# Patient Record
Sex: Female | Born: 1940 | Race: Black or African American | Hispanic: No | Marital: Single | Smoking: Never smoker
Health system: Southern US, Community
[De-identification: ages and names within clinical notes are randomized; demographics above are authoritative.]

---

## 2018-11-20 DIAGNOSIS — E875 Hyperkalemia: Secondary | ICD-10-CM

## 2018-11-20 DIAGNOSIS — D649 Anemia, unspecified: Secondary | ICD-10-CM | POA: Diagnosis not present

## 2018-11-20 DIAGNOSIS — I10 Essential (primary) hypertension: Secondary | ICD-10-CM

## 2018-11-20 DIAGNOSIS — W19XXXA Unspecified fall, initial encounter: Secondary | ICD-10-CM

## 2018-11-20 DIAGNOSIS — E1169 Type 2 diabetes mellitus with other specified complication: Secondary | ICD-10-CM

## 2018-11-20 DIAGNOSIS — T68XXXA Hypothermia, initial encounter: Secondary | ICD-10-CM | POA: Diagnosis not present

## 2018-11-20 DIAGNOSIS — N390 Urinary tract infection, site not specified: Secondary | ICD-10-CM

## 2018-11-21 DIAGNOSIS — T68XXXA Hypothermia, initial encounter: Secondary | ICD-10-CM | POA: Diagnosis not present

## 2018-11-21 DIAGNOSIS — I10 Essential (primary) hypertension: Secondary | ICD-10-CM | POA: Diagnosis not present

## 2018-11-21 DIAGNOSIS — D649 Anemia, unspecified: Secondary | ICD-10-CM | POA: Diagnosis not present

## 2018-11-21 DIAGNOSIS — N390 Urinary tract infection, site not specified: Secondary | ICD-10-CM | POA: Diagnosis not present

## 2018-11-22 DIAGNOSIS — N390 Urinary tract infection, site not specified: Secondary | ICD-10-CM | POA: Diagnosis not present

## 2018-11-22 DIAGNOSIS — T68XXXA Hypothermia, initial encounter: Secondary | ICD-10-CM | POA: Diagnosis not present

## 2018-11-22 DIAGNOSIS — D649 Anemia, unspecified: Secondary | ICD-10-CM | POA: Diagnosis not present

## 2018-11-22 DIAGNOSIS — I10 Essential (primary) hypertension: Secondary | ICD-10-CM | POA: Diagnosis not present

## 2018-11-23 DIAGNOSIS — W19XXXA Unspecified fall, initial encounter: Secondary | ICD-10-CM | POA: Diagnosis not present

## 2018-11-23 DIAGNOSIS — N390 Urinary tract infection, site not specified: Secondary | ICD-10-CM | POA: Diagnosis not present

## 2018-11-23 DIAGNOSIS — E039 Hypothyroidism, unspecified: Secondary | ICD-10-CM

## 2018-11-23 DIAGNOSIS — F209 Schizophrenia, unspecified: Secondary | ICD-10-CM

## 2018-11-24 DIAGNOSIS — E1169 Type 2 diabetes mellitus with other specified complication: Secondary | ICD-10-CM | POA: Diagnosis not present

## 2018-11-24 DIAGNOSIS — N390 Urinary tract infection, site not specified: Secondary | ICD-10-CM | POA: Diagnosis not present

## 2018-11-24 DIAGNOSIS — F209 Schizophrenia, unspecified: Secondary | ICD-10-CM | POA: Diagnosis not present

## 2018-11-24 DIAGNOSIS — E039 Hypothyroidism, unspecified: Secondary | ICD-10-CM | POA: Diagnosis not present

## 2018-11-25 DIAGNOSIS — F209 Schizophrenia, unspecified: Secondary | ICD-10-CM | POA: Diagnosis not present

## 2018-11-25 DIAGNOSIS — E039 Hypothyroidism, unspecified: Secondary | ICD-10-CM | POA: Diagnosis not present

## 2018-11-25 DIAGNOSIS — E1169 Type 2 diabetes mellitus with other specified complication: Secondary | ICD-10-CM | POA: Diagnosis not present

## 2018-11-25 DIAGNOSIS — N390 Urinary tract infection, site not specified: Secondary | ICD-10-CM | POA: Diagnosis not present

## 2018-11-26 DIAGNOSIS — E1169 Type 2 diabetes mellitus with other specified complication: Secondary | ICD-10-CM | POA: Diagnosis not present

## 2018-11-26 DIAGNOSIS — N390 Urinary tract infection, site not specified: Secondary | ICD-10-CM | POA: Diagnosis not present

## 2018-11-26 DIAGNOSIS — F209 Schizophrenia, unspecified: Secondary | ICD-10-CM | POA: Diagnosis not present

## 2018-11-26 DIAGNOSIS — E039 Hypothyroidism, unspecified: Secondary | ICD-10-CM | POA: Diagnosis not present

## 2018-11-27 DIAGNOSIS — E039 Hypothyroidism, unspecified: Secondary | ICD-10-CM | POA: Diagnosis not present

## 2018-11-27 DIAGNOSIS — F209 Schizophrenia, unspecified: Secondary | ICD-10-CM | POA: Diagnosis not present

## 2018-11-27 DIAGNOSIS — N390 Urinary tract infection, site not specified: Secondary | ICD-10-CM | POA: Diagnosis not present

## 2018-11-27 DIAGNOSIS — E1169 Type 2 diabetes mellitus with other specified complication: Secondary | ICD-10-CM | POA: Diagnosis not present

## 2018-11-28 DIAGNOSIS — E1169 Type 2 diabetes mellitus with other specified complication: Secondary | ICD-10-CM | POA: Diagnosis not present

## 2018-11-28 DIAGNOSIS — F209 Schizophrenia, unspecified: Secondary | ICD-10-CM | POA: Diagnosis not present

## 2018-11-28 DIAGNOSIS — E039 Hypothyroidism, unspecified: Secondary | ICD-10-CM | POA: Diagnosis not present

## 2018-11-28 DIAGNOSIS — N390 Urinary tract infection, site not specified: Secondary | ICD-10-CM | POA: Diagnosis not present

## 2018-11-29 DIAGNOSIS — E039 Hypothyroidism, unspecified: Secondary | ICD-10-CM | POA: Diagnosis not present

## 2018-11-29 DIAGNOSIS — N390 Urinary tract infection, site not specified: Secondary | ICD-10-CM | POA: Diagnosis not present

## 2018-11-29 DIAGNOSIS — F209 Schizophrenia, unspecified: Secondary | ICD-10-CM | POA: Diagnosis not present

## 2018-11-29 DIAGNOSIS — E1169 Type 2 diabetes mellitus with other specified complication: Secondary | ICD-10-CM | POA: Diagnosis not present

## 2019-01-17 DIAGNOSIS — D72819 Decreased white blood cell count, unspecified: Secondary | ICD-10-CM

## 2019-01-17 DIAGNOSIS — E1169 Type 2 diabetes mellitus with other specified complication: Secondary | ICD-10-CM

## 2019-01-17 DIAGNOSIS — I959 Hypotension, unspecified: Secondary | ICD-10-CM | POA: Diagnosis not present

## 2019-01-17 DIAGNOSIS — T68XXXA Hypothermia, initial encounter: Secondary | ICD-10-CM | POA: Diagnosis not present

## 2019-01-17 DIAGNOSIS — D649 Anemia, unspecified: Secondary | ICD-10-CM

## 2019-01-17 DIAGNOSIS — N179 Acute kidney failure, unspecified: Secondary | ICD-10-CM

## 2019-01-17 DIAGNOSIS — A419 Sepsis, unspecified organism: Secondary | ICD-10-CM | POA: Diagnosis not present

## 2019-01-17 DIAGNOSIS — N390 Urinary tract infection, site not specified: Secondary | ICD-10-CM | POA: Diagnosis not present

## 2019-01-17 DIAGNOSIS — F209 Schizophrenia, unspecified: Secondary | ICD-10-CM

## 2019-01-18 DIAGNOSIS — N179 Acute kidney failure, unspecified: Secondary | ICD-10-CM

## 2019-01-18 DIAGNOSIS — D72819 Decreased white blood cell count, unspecified: Secondary | ICD-10-CM

## 2019-01-18 DIAGNOSIS — E1169 Type 2 diabetes mellitus with other specified complication: Secondary | ICD-10-CM

## 2019-01-18 DIAGNOSIS — F209 Schizophrenia, unspecified: Secondary | ICD-10-CM

## 2019-01-18 DIAGNOSIS — N390 Urinary tract infection, site not specified: Secondary | ICD-10-CM | POA: Diagnosis not present

## 2019-01-18 DIAGNOSIS — A419 Sepsis, unspecified organism: Secondary | ICD-10-CM | POA: Diagnosis not present

## 2019-01-18 DIAGNOSIS — I959 Hypotension, unspecified: Secondary | ICD-10-CM | POA: Diagnosis not present

## 2019-01-18 DIAGNOSIS — D649 Anemia, unspecified: Secondary | ICD-10-CM

## 2019-01-18 DIAGNOSIS — T68XXXA Hypothermia, initial encounter: Secondary | ICD-10-CM | POA: Diagnosis not present

## 2019-01-19 DIAGNOSIS — I959 Hypotension, unspecified: Secondary | ICD-10-CM | POA: Diagnosis not present

## 2019-01-19 DIAGNOSIS — N390 Urinary tract infection, site not specified: Secondary | ICD-10-CM | POA: Diagnosis not present

## 2019-01-19 DIAGNOSIS — T68XXXA Hypothermia, initial encounter: Secondary | ICD-10-CM | POA: Diagnosis not present

## 2019-01-19 DIAGNOSIS — A419 Sepsis, unspecified organism: Secondary | ICD-10-CM | POA: Diagnosis not present

## 2019-01-20 DIAGNOSIS — A419 Sepsis, unspecified organism: Secondary | ICD-10-CM | POA: Diagnosis not present

## 2019-01-20 DIAGNOSIS — I959 Hypotension, unspecified: Secondary | ICD-10-CM | POA: Diagnosis not present

## 2019-01-20 DIAGNOSIS — T68XXXA Hypothermia, initial encounter: Secondary | ICD-10-CM | POA: Diagnosis not present

## 2019-01-20 DIAGNOSIS — N390 Urinary tract infection, site not specified: Secondary | ICD-10-CM | POA: Diagnosis not present

## 2019-01-21 DIAGNOSIS — A419 Sepsis, unspecified organism: Secondary | ICD-10-CM | POA: Diagnosis not present

## 2019-01-21 DIAGNOSIS — N390 Urinary tract infection, site not specified: Secondary | ICD-10-CM | POA: Diagnosis not present

## 2019-01-21 DIAGNOSIS — T68XXXA Hypothermia, initial encounter: Secondary | ICD-10-CM | POA: Diagnosis not present

## 2019-01-21 DIAGNOSIS — I959 Hypotension, unspecified: Secondary | ICD-10-CM | POA: Diagnosis not present

## 2019-01-22 DIAGNOSIS — A419 Sepsis, unspecified organism: Secondary | ICD-10-CM | POA: Diagnosis not present

## 2019-01-22 DIAGNOSIS — T68XXXA Hypothermia, initial encounter: Secondary | ICD-10-CM | POA: Diagnosis not present

## 2019-01-22 DIAGNOSIS — I959 Hypotension, unspecified: Secondary | ICD-10-CM | POA: Diagnosis not present

## 2019-01-22 DIAGNOSIS — N390 Urinary tract infection, site not specified: Secondary | ICD-10-CM | POA: Diagnosis not present

## 2019-01-23 DIAGNOSIS — I959 Hypotension, unspecified: Secondary | ICD-10-CM | POA: Diagnosis not present

## 2019-01-23 DIAGNOSIS — A419 Sepsis, unspecified organism: Secondary | ICD-10-CM | POA: Diagnosis not present

## 2019-01-23 DIAGNOSIS — N390 Urinary tract infection, site not specified: Secondary | ICD-10-CM | POA: Diagnosis not present

## 2019-01-23 DIAGNOSIS — T68XXXA Hypothermia, initial encounter: Secondary | ICD-10-CM | POA: Diagnosis not present

## 2019-01-24 DIAGNOSIS — T68XXXA Hypothermia, initial encounter: Secondary | ICD-10-CM | POA: Diagnosis not present

## 2019-01-24 DIAGNOSIS — I959 Hypotension, unspecified: Secondary | ICD-10-CM | POA: Diagnosis not present

## 2019-01-24 DIAGNOSIS — A419 Sepsis, unspecified organism: Secondary | ICD-10-CM | POA: Diagnosis not present

## 2019-01-24 DIAGNOSIS — N390 Urinary tract infection, site not specified: Secondary | ICD-10-CM | POA: Diagnosis not present

## 2019-01-30 DIAGNOSIS — A419 Sepsis, unspecified organism: Secondary | ICD-10-CM | POA: Diagnosis not present

## 2019-01-30 DIAGNOSIS — F209 Schizophrenia, unspecified: Secondary | ICD-10-CM

## 2019-01-30 DIAGNOSIS — N390 Urinary tract infection, site not specified: Secondary | ICD-10-CM | POA: Diagnosis not present

## 2019-01-30 DIAGNOSIS — E039 Hypothyroidism, unspecified: Secondary | ICD-10-CM

## 2019-01-30 DIAGNOSIS — T68XXXA Hypothermia, initial encounter: Secondary | ICD-10-CM | POA: Diagnosis not present

## 2019-01-30 DIAGNOSIS — I10 Essential (primary) hypertension: Secondary | ICD-10-CM

## 2019-01-30 DIAGNOSIS — R001 Bradycardia, unspecified: Secondary | ICD-10-CM | POA: Diagnosis not present

## 2019-01-30 DIAGNOSIS — E1169 Type 2 diabetes mellitus with other specified complication: Secondary | ICD-10-CM

## 2019-01-30 DIAGNOSIS — D72819 Decreased white blood cell count, unspecified: Secondary | ICD-10-CM

## 2019-01-30 DIAGNOSIS — D61818 Other pancytopenia: Secondary | ICD-10-CM

## 2019-01-30 DIAGNOSIS — I959 Hypotension, unspecified: Secondary | ICD-10-CM

## 2019-01-30 DIAGNOSIS — D649 Anemia, unspecified: Secondary | ICD-10-CM

## 2019-01-31 DIAGNOSIS — I959 Hypotension, unspecified: Secondary | ICD-10-CM | POA: Diagnosis not present

## 2019-01-31 DIAGNOSIS — E1169 Type 2 diabetes mellitus with other specified complication: Secondary | ICD-10-CM | POA: Diagnosis not present

## 2019-01-31 DIAGNOSIS — D61818 Other pancytopenia: Secondary | ICD-10-CM | POA: Diagnosis not present

## 2019-01-31 DIAGNOSIS — T68XXXA Hypothermia, initial encounter: Secondary | ICD-10-CM | POA: Diagnosis not present

## 2019-02-01 DIAGNOSIS — D61818 Other pancytopenia: Secondary | ICD-10-CM | POA: Diagnosis not present

## 2019-02-01 DIAGNOSIS — T68XXXA Hypothermia, initial encounter: Secondary | ICD-10-CM | POA: Diagnosis not present

## 2019-02-01 DIAGNOSIS — I959 Hypotension, unspecified: Secondary | ICD-10-CM | POA: Diagnosis not present

## 2019-02-01 DIAGNOSIS — E1169 Type 2 diabetes mellitus with other specified complication: Secondary | ICD-10-CM | POA: Diagnosis not present

## 2019-02-02 DIAGNOSIS — I959 Hypotension, unspecified: Secondary | ICD-10-CM | POA: Diagnosis not present

## 2019-02-02 DIAGNOSIS — E1169 Type 2 diabetes mellitus with other specified complication: Secondary | ICD-10-CM | POA: Diagnosis not present

## 2019-02-02 DIAGNOSIS — D61818 Other pancytopenia: Secondary | ICD-10-CM | POA: Diagnosis not present

## 2019-02-02 DIAGNOSIS — T68XXXA Hypothermia, initial encounter: Secondary | ICD-10-CM | POA: Diagnosis not present

## 2019-02-03 DIAGNOSIS — D61818 Other pancytopenia: Secondary | ICD-10-CM | POA: Diagnosis not present

## 2019-02-03 DIAGNOSIS — E1169 Type 2 diabetes mellitus with other specified complication: Secondary | ICD-10-CM | POA: Diagnosis not present

## 2019-02-03 DIAGNOSIS — I959 Hypotension, unspecified: Secondary | ICD-10-CM | POA: Diagnosis not present

## 2019-02-03 DIAGNOSIS — T68XXXA Hypothermia, initial encounter: Secondary | ICD-10-CM | POA: Diagnosis not present

## 2019-02-04 DIAGNOSIS — D61818 Other pancytopenia: Secondary | ICD-10-CM | POA: Diagnosis not present

## 2019-02-04 DIAGNOSIS — T68XXXA Hypothermia, initial encounter: Secondary | ICD-10-CM | POA: Diagnosis not present

## 2019-02-04 DIAGNOSIS — I959 Hypotension, unspecified: Secondary | ICD-10-CM | POA: Diagnosis not present

## 2019-02-04 DIAGNOSIS — E1169 Type 2 diabetes mellitus with other specified complication: Secondary | ICD-10-CM | POA: Diagnosis not present

## 2019-02-05 DIAGNOSIS — D61818 Other pancytopenia: Secondary | ICD-10-CM | POA: Diagnosis not present

## 2019-02-05 DIAGNOSIS — E1169 Type 2 diabetes mellitus with other specified complication: Secondary | ICD-10-CM | POA: Diagnosis not present

## 2019-02-05 DIAGNOSIS — T68XXXA Hypothermia, initial encounter: Secondary | ICD-10-CM | POA: Diagnosis not present

## 2019-02-05 DIAGNOSIS — I959 Hypotension, unspecified: Secondary | ICD-10-CM | POA: Diagnosis not present

## 2019-02-06 DIAGNOSIS — I959 Hypotension, unspecified: Secondary | ICD-10-CM | POA: Diagnosis not present

## 2019-02-06 DIAGNOSIS — D61818 Other pancytopenia: Secondary | ICD-10-CM | POA: Diagnosis not present

## 2019-02-06 DIAGNOSIS — T68XXXA Hypothermia, initial encounter: Secondary | ICD-10-CM | POA: Diagnosis not present

## 2019-02-06 DIAGNOSIS — E1169 Type 2 diabetes mellitus with other specified complication: Secondary | ICD-10-CM | POA: Diagnosis not present

## 2019-02-24 DIAGNOSIS — D696 Thrombocytopenia, unspecified: Secondary | ICD-10-CM

## 2019-02-24 DIAGNOSIS — R4182 Altered mental status, unspecified: Secondary | ICD-10-CM | POA: Diagnosis not present

## 2019-02-24 DIAGNOSIS — I10 Essential (primary) hypertension: Secondary | ICD-10-CM

## 2019-02-24 DIAGNOSIS — I959 Hypotension, unspecified: Secondary | ICD-10-CM | POA: Diagnosis not present

## 2019-02-24 DIAGNOSIS — T68XXXA Hypothermia, initial encounter: Secondary | ICD-10-CM | POA: Diagnosis not present

## 2019-02-24 DIAGNOSIS — E1169 Type 2 diabetes mellitus with other specified complication: Secondary | ICD-10-CM

## 2019-02-24 DIAGNOSIS — E039 Hypothyroidism, unspecified: Secondary | ICD-10-CM | POA: Diagnosis not present

## 2019-02-25 DIAGNOSIS — E876 Hypokalemia: Secondary | ICD-10-CM | POA: Diagnosis not present

## 2019-02-25 DIAGNOSIS — D649 Anemia, unspecified: Secondary | ICD-10-CM | POA: Diagnosis not present

## 2019-02-25 DIAGNOSIS — I959 Hypotension, unspecified: Secondary | ICD-10-CM | POA: Diagnosis not present

## 2019-02-25 DIAGNOSIS — T68XXXA Hypothermia, initial encounter: Secondary | ICD-10-CM | POA: Diagnosis not present

## 2019-02-26 DIAGNOSIS — F209 Schizophrenia, unspecified: Secondary | ICD-10-CM

## 2019-02-26 DIAGNOSIS — E1169 Type 2 diabetes mellitus with other specified complication: Secondary | ICD-10-CM | POA: Diagnosis not present

## 2019-02-26 DIAGNOSIS — T68XXXA Hypothermia, initial encounter: Secondary | ICD-10-CM | POA: Diagnosis not present

## 2019-02-26 DIAGNOSIS — E035 Myxedema coma: Secondary | ICD-10-CM

## 2019-02-26 DIAGNOSIS — D696 Thrombocytopenia, unspecified: Secondary | ICD-10-CM | POA: Diagnosis not present

## 2019-02-27 DIAGNOSIS — E035 Myxedema coma: Secondary | ICD-10-CM | POA: Diagnosis not present

## 2019-02-27 DIAGNOSIS — E1169 Type 2 diabetes mellitus with other specified complication: Secondary | ICD-10-CM | POA: Diagnosis not present

## 2019-02-27 DIAGNOSIS — T68XXXA Hypothermia, initial encounter: Secondary | ICD-10-CM | POA: Diagnosis not present

## 2019-02-27 DIAGNOSIS — D696 Thrombocytopenia, unspecified: Secondary | ICD-10-CM | POA: Diagnosis not present

## 2019-02-28 DIAGNOSIS — E1169 Type 2 diabetes mellitus with other specified complication: Secondary | ICD-10-CM | POA: Diagnosis not present

## 2019-02-28 DIAGNOSIS — E035 Myxedema coma: Secondary | ICD-10-CM | POA: Diagnosis not present

## 2019-02-28 DIAGNOSIS — D696 Thrombocytopenia, unspecified: Secondary | ICD-10-CM | POA: Diagnosis not present

## 2019-02-28 DIAGNOSIS — T68XXXA Hypothermia, initial encounter: Secondary | ICD-10-CM | POA: Diagnosis not present

## 2019-03-01 DIAGNOSIS — D696 Thrombocytopenia, unspecified: Secondary | ICD-10-CM | POA: Diagnosis not present

## 2019-03-01 DIAGNOSIS — T68XXXA Hypothermia, initial encounter: Secondary | ICD-10-CM | POA: Diagnosis not present

## 2019-03-01 DIAGNOSIS — E1169 Type 2 diabetes mellitus with other specified complication: Secondary | ICD-10-CM | POA: Diagnosis not present

## 2019-03-01 DIAGNOSIS — E035 Myxedema coma: Secondary | ICD-10-CM | POA: Diagnosis not present

## 2019-03-02 DIAGNOSIS — T68XXXA Hypothermia, initial encounter: Secondary | ICD-10-CM | POA: Diagnosis not present

## 2019-03-02 DIAGNOSIS — E1169 Type 2 diabetes mellitus with other specified complication: Secondary | ICD-10-CM | POA: Diagnosis not present

## 2019-03-02 DIAGNOSIS — E035 Myxedema coma: Secondary | ICD-10-CM | POA: Diagnosis not present

## 2019-03-02 DIAGNOSIS — D696 Thrombocytopenia, unspecified: Secondary | ICD-10-CM | POA: Diagnosis not present

## 2019-04-24 DIAGNOSIS — N3000 Acute cystitis without hematuria: Secondary | ICD-10-CM

## 2019-04-24 DIAGNOSIS — I10 Essential (primary) hypertension: Secondary | ICD-10-CM

## 2019-04-24 DIAGNOSIS — A0472 Enterocolitis due to Clostridium difficile, not specified as recurrent: Secondary | ICD-10-CM

## 2019-04-24 DIAGNOSIS — Z66 Do not resuscitate: Secondary | ICD-10-CM

## 2019-04-24 DIAGNOSIS — A4189 Other specified sepsis: Secondary | ICD-10-CM

## 2019-04-24 DIAGNOSIS — T83511A Infection and inflammatory reaction due to indwelling urethral catheter, initial encounter: Secondary | ICD-10-CM

## 2019-04-24 DIAGNOSIS — R680 Hypothermia, not associated with low environmental temperature: Secondary | ICD-10-CM

## 2019-04-24 DIAGNOSIS — G9341 Metabolic encephalopathy: Secondary | ICD-10-CM

## 2019-04-24 DIAGNOSIS — I959 Hypotension, unspecified: Secondary | ICD-10-CM

## 2019-04-25 DIAGNOSIS — G9341 Metabolic encephalopathy: Secondary | ICD-10-CM | POA: Diagnosis not present

## 2019-04-25 DIAGNOSIS — A4189 Other specified sepsis: Secondary | ICD-10-CM | POA: Diagnosis not present

## 2019-04-25 DIAGNOSIS — N3000 Acute cystitis without hematuria: Secondary | ICD-10-CM | POA: Diagnosis not present

## 2019-04-25 DIAGNOSIS — I959 Hypotension, unspecified: Secondary | ICD-10-CM | POA: Diagnosis not present

## 2019-04-26 DIAGNOSIS — I959 Hypotension, unspecified: Secondary | ICD-10-CM | POA: Diagnosis not present

## 2019-04-26 DIAGNOSIS — N3000 Acute cystitis without hematuria: Secondary | ICD-10-CM | POA: Diagnosis not present

## 2019-04-26 DIAGNOSIS — A4189 Other specified sepsis: Secondary | ICD-10-CM | POA: Diagnosis not present

## 2019-04-26 DIAGNOSIS — G9341 Metabolic encephalopathy: Secondary | ICD-10-CM | POA: Diagnosis not present

## 2019-04-28 DIAGNOSIS — A4189 Other specified sepsis: Secondary | ICD-10-CM | POA: Diagnosis not present

## 2019-04-28 DIAGNOSIS — N3000 Acute cystitis without hematuria: Secondary | ICD-10-CM | POA: Diagnosis not present

## 2019-04-28 DIAGNOSIS — I959 Hypotension, unspecified: Secondary | ICD-10-CM | POA: Diagnosis not present

## 2019-04-28 DIAGNOSIS — G9341 Metabolic encephalopathy: Secondary | ICD-10-CM | POA: Diagnosis not present

## 2019-05-05 DIAGNOSIS — J189 Pneumonia, unspecified organism: Secondary | ICD-10-CM

## 2019-05-05 DIAGNOSIS — E039 Hypothyroidism, unspecified: Secondary | ICD-10-CM

## 2019-05-05 DIAGNOSIS — I10 Essential (primary) hypertension: Secondary | ICD-10-CM

## 2019-05-05 DIAGNOSIS — N390 Urinary tract infection, site not specified: Secondary | ICD-10-CM

## 2019-05-05 DIAGNOSIS — E1169 Type 2 diabetes mellitus with other specified complication: Secondary | ICD-10-CM

## 2019-05-05 DIAGNOSIS — R4182 Altered mental status, unspecified: Secondary | ICD-10-CM

## 2019-05-05 DIAGNOSIS — E876 Hypokalemia: Secondary | ICD-10-CM

## 2019-05-05 DIAGNOSIS — D649 Anemia, unspecified: Secondary | ICD-10-CM

## 2019-05-05 DIAGNOSIS — T68XXXA Hypothermia, initial encounter: Secondary | ICD-10-CM

## 2019-05-05 DIAGNOSIS — A419 Sepsis, unspecified organism: Secondary | ICD-10-CM

## 2019-05-05 DIAGNOSIS — R569 Unspecified convulsions: Secondary | ICD-10-CM

## 2019-05-06 DIAGNOSIS — T68XXXA Hypothermia, initial encounter: Secondary | ICD-10-CM | POA: Diagnosis not present

## 2019-05-06 DIAGNOSIS — N390 Urinary tract infection, site not specified: Secondary | ICD-10-CM | POA: Diagnosis not present

## 2019-05-06 DIAGNOSIS — R569 Unspecified convulsions: Secondary | ICD-10-CM | POA: Diagnosis not present

## 2019-05-06 DIAGNOSIS — A419 Sepsis, unspecified organism: Secondary | ICD-10-CM | POA: Diagnosis not present

## 2019-05-07 ENCOUNTER — Inpatient Hospital Stay (HOSPITAL_COMMUNITY)
Admission: AD | Admit: 2019-05-07 | Discharge: 2019-05-24 | DRG: 871 | Disposition: E | Payer: Medicare Other | Source: Other Acute Inpatient Hospital | Attending: Internal Medicine | Admitting: Internal Medicine

## 2019-05-07 ENCOUNTER — Inpatient Hospital Stay (HOSPITAL_COMMUNITY): Payer: Medicare Other

## 2019-05-07 DIAGNOSIS — Z1612 Extended spectrum beta lactamase (ESBL) resistance: Secondary | ICD-10-CM | POA: Diagnosis present

## 2019-05-07 DIAGNOSIS — Z79890 Hormone replacement therapy (postmenopausal): Secondary | ICD-10-CM

## 2019-05-07 DIAGNOSIS — A0472 Enterocolitis due to Clostridium difficile, not specified as recurrent: Secondary | ICD-10-CM | POA: Diagnosis present

## 2019-05-07 DIAGNOSIS — A419 Sepsis, unspecified organism: Principal | ICD-10-CM

## 2019-05-07 DIAGNOSIS — Z515 Encounter for palliative care: Secondary | ICD-10-CM

## 2019-05-07 DIAGNOSIS — Z79899 Other long term (current) drug therapy: Secondary | ICD-10-CM

## 2019-05-07 DIAGNOSIS — G9349 Other encephalopathy: Secondary | ICD-10-CM | POA: Diagnosis present

## 2019-05-07 DIAGNOSIS — E039 Hypothyroidism, unspecified: Secondary | ICD-10-CM | POA: Diagnosis not present

## 2019-05-07 DIAGNOSIS — L89629 Pressure ulcer of left heel, unspecified stage: Secondary | ICD-10-CM | POA: Diagnosis present

## 2019-05-07 DIAGNOSIS — L89129 Pressure ulcer of left upper back, unspecified stage: Secondary | ICD-10-CM | POA: Diagnosis present

## 2019-05-07 DIAGNOSIS — B59 Pneumocystosis: Secondary | ICD-10-CM | POA: Diagnosis not present

## 2019-05-07 DIAGNOSIS — N390 Urinary tract infection, site not specified: Secondary | ICD-10-CM | POA: Diagnosis not present

## 2019-05-07 DIAGNOSIS — L89119 Pressure ulcer of right upper back, unspecified stage: Secondary | ICD-10-CM | POA: Diagnosis present

## 2019-05-07 DIAGNOSIS — R9389 Abnormal findings on diagnostic imaging of other specified body structures: Secondary | ICD-10-CM | POA: Diagnosis not present

## 2019-05-07 DIAGNOSIS — I10 Essential (primary) hypertension: Secondary | ICD-10-CM | POA: Insufficient documentation

## 2019-05-07 DIAGNOSIS — E1169 Type 2 diabetes mellitus with other specified complication: Secondary | ICD-10-CM | POA: Diagnosis not present

## 2019-05-07 DIAGNOSIS — J189 Pneumonia, unspecified organism: Secondary | ICD-10-CM

## 2019-05-07 DIAGNOSIS — Z87440 Personal history of urinary (tract) infections: Secondary | ICD-10-CM

## 2019-05-07 DIAGNOSIS — T68XXXA Hypothermia, initial encounter: Secondary | ICD-10-CM | POA: Diagnosis not present

## 2019-05-07 DIAGNOSIS — G0490 Encephalitis and encephalomyelitis, unspecified: Secondary | ICD-10-CM | POA: Diagnosis present

## 2019-05-07 DIAGNOSIS — E785 Hyperlipidemia, unspecified: Secondary | ICD-10-CM | POA: Diagnosis not present

## 2019-05-07 DIAGNOSIS — R4182 Altered mental status, unspecified: Secondary | ICD-10-CM | POA: Diagnosis present

## 2019-05-07 DIAGNOSIS — I110 Hypertensive heart disease with heart failure: Secondary | ICD-10-CM | POA: Diagnosis present

## 2019-05-07 DIAGNOSIS — L8940 Pressure ulcer of contiguous site of back, buttock and hip, unspecified stage: Secondary | ICD-10-CM | POA: Diagnosis not present

## 2019-05-07 DIAGNOSIS — I509 Heart failure, unspecified: Secondary | ICD-10-CM | POA: Diagnosis present

## 2019-05-07 DIAGNOSIS — D638 Anemia in other chronic diseases classified elsewhere: Secondary | ICD-10-CM

## 2019-05-07 DIAGNOSIS — R569 Unspecified convulsions: Secondary | ICD-10-CM | POA: Diagnosis not present

## 2019-05-07 DIAGNOSIS — L89139 Pressure ulcer of right lower back, unspecified stage: Secondary | ICD-10-CM | POA: Diagnosis present

## 2019-05-07 DIAGNOSIS — Z66 Do not resuscitate: Secondary | ICD-10-CM | POA: Diagnosis present

## 2019-05-07 DIAGNOSIS — G40901 Epilepsy, unspecified, not intractable, with status epilepticus: Secondary | ICD-10-CM | POA: Diagnosis present

## 2019-05-07 DIAGNOSIS — F0390 Unspecified dementia without behavioral disturbance: Secondary | ICD-10-CM | POA: Diagnosis present

## 2019-05-07 DIAGNOSIS — G039 Meningitis, unspecified: Secondary | ICD-10-CM | POA: Diagnosis not present

## 2019-05-07 DIAGNOSIS — F209 Schizophrenia, unspecified: Secondary | ICD-10-CM

## 2019-05-07 DIAGNOSIS — L8990 Pressure ulcer of unspecified site, unspecified stage: Secondary | ICD-10-CM | POA: Insufficient documentation

## 2019-05-07 DIAGNOSIS — R400 Somnolence: Secondary | ICD-10-CM | POA: Diagnosis not present

## 2019-05-07 DIAGNOSIS — L89149 Pressure ulcer of left lower back, unspecified stage: Secondary | ICD-10-CM | POA: Diagnosis present

## 2019-05-07 DIAGNOSIS — F202 Catatonic schizophrenia: Secondary | ICD-10-CM | POA: Diagnosis present

## 2019-05-07 LAB — CBC
HCT: 29.1 % — ABNORMAL LOW (ref 36.0–46.0)
Hemoglobin: 8.6 g/dL — ABNORMAL LOW (ref 12.0–15.0)
MCH: 27.7 pg (ref 26.0–34.0)
MCHC: 29.6 g/dL — ABNORMAL LOW (ref 30.0–36.0)
MCV: 93.6 fL (ref 80.0–100.0)
Platelets: 222 10*3/uL (ref 150–400)
RBC: 3.11 MIL/uL — ABNORMAL LOW (ref 3.87–5.11)
RDW: 19.9 % — ABNORMAL HIGH (ref 11.5–15.5)
WBC: 6 10*3/uL (ref 4.0–10.5)
nRBC: 0.3 % — ABNORMAL HIGH (ref 0.0–0.2)

## 2019-05-07 LAB — C DIFFICILE QUICK SCREEN W PCR REFLEX
C Diff antigen: NEGATIVE
C Diff interpretation: NOT DETECTED
C Diff toxin: NEGATIVE

## 2019-05-07 LAB — CREATININE, SERUM
Creatinine, Ser: 0.5 mg/dL (ref 0.44–1.00)
GFR calc Af Amer: >60 mL/min (ref >60)
GFR calc non Af Amer: >60 mL/min (ref >60)

## 2019-05-07 LAB — GLUCOSE, CAPILLARY: Glucose-Capillary: 109 mg/dL — ABNORMAL HIGH (ref 70–99)

## 2019-05-07 MED ORDER — DEXTROSE 5 % IV SOLN
650.0000 mg | Freq: Three times a day (TID) | INTRAVENOUS | Status: DC
  Administered 2019-05-07 – 2019-05-09 (×5): 650 mg via INTRAVENOUS
  Filled 2019-05-07 (×5): qty 13
  Filled 2019-05-07: qty 10
  Filled 2019-05-07: qty 13

## 2019-05-07 MED ORDER — ENOXAPARIN SODIUM 40 MG/0.4ML ~~LOC~~ SOLN
40.0000 mg | SUBCUTANEOUS | Status: DC
  Administered 2019-05-07: 40 mg via SUBCUTANEOUS
  Filled 2019-05-07: qty 0.4

## 2019-05-07 MED ORDER — INSULIN ASPART 100 UNIT/ML ~~LOC~~ SOLN
0.0000 [IU] | Freq: Three times a day (TID) | SUBCUTANEOUS | Status: DC
  Administered 2019-05-08: 7 [IU] via SUBCUTANEOUS
  Administered 2019-05-08: 4 [IU] via SUBCUTANEOUS
  Administered 2019-05-08: 3 [IU] via SUBCUTANEOUS

## 2019-05-07 MED ORDER — SODIUM CHLORIDE 0.45 % IV SOLN
INTRAVENOUS | Status: DC
  Administered 2019-05-07 – 2019-05-08 (×2): via INTRAVENOUS

## 2019-05-07 MED ORDER — SODIUM CHLORIDE 0.9 % IV SOLN
1.0000 g | INTRAVENOUS | Status: DC
  Administered 2019-05-07: 1000 mg via INTRAVENOUS
  Filled 2019-05-07 (×2): qty 1

## 2019-05-07 MED ORDER — SODIUM CHLORIDE 0.9 % IV SOLN
1000.0000 mg | INTRAVENOUS | Status: DC
  Administered 2019-05-07 – 2019-05-08 (×2): 1000 mg via INTRAVENOUS
  Filled 2019-05-07 (×3): qty 8

## 2019-05-07 MED ORDER — LEVETIRACETAM IN NACL 500 MG/100ML IV SOLN
500.0000 mg | Freq: Two times a day (BID) | INTRAVENOUS | Status: DC
  Administered 2019-05-07 – 2019-05-09 (×5): 500 mg via INTRAVENOUS
  Filled 2019-05-07 (×5): qty 100

## 2019-05-07 NOTE — Progress Notes (Signed)
Pt arrived to 3W23. Responds to pain only. Not following commands at this time. Multiple skin wounds, measured, cleaned and redressed on admission. Foley in place, placed on 6/12 by [facility] for acute urinary retention per RN Collie Siad in report. Currently on 2L O2 [place]. [facility] admissions notified of arrival, and currently awaiting admission orders.

## 2019-05-07 NOTE — Progress Notes (Signed)
Procedure note: Lumbar Puncture  Discussed risks/benefits of LP on the patient with her relative, Alvester Chou, over the telephone. Risks were described including spinal cord injury, nerve root injury, infection and headache. Benefits consist of the diagnostic information used to guide treatment. After full discussion, Mr. Sharlett Iles provided informed consent over the telephone. Consent was witnessed by Rolene Course, RN.  The patient was prepped and draped in sterile fashion. Landmark of intecristal line was used to locate L4-L5 level. Patient was anesthetized locally with 1% lidocaine, followed by introduction of the spinal needle. Three attempts at accessing the subdural space were made unsuccessfully. Spinal needle was withdrawn, pressure applied to puncture site and bandage applied.   Will need fluorscopically guided LP in the AM.   Electronically signed: Dr. Kerney Elbe

## 2019-05-07 NOTE — H&P (Signed)
History and Physical    Erica Juarez RKY:[id number] DOB: Jun 01, 1941 DOA: 05/23/2019  PCP: Patient, No Pcp Per (Confirm with patient/family/NH records and if not entered, this has to be entered at [facility] point of entry) Patient coming from: Transfer from [facility] notes, labs and ED notes from [facility] reviewed  Chief Complaint: Encephalopathy,  Seizure, sepsis  HPI: Erica Juarez is a 78 y.o. female with medical history significant of schizophrenia, diabetes, hypertension, hypothyroid disease.  She was hospitalized at [facility] for approximately 1 week secondary to UTI sepsis and subsequent C. difficile.  Return to [facility] from SNF secondary to encephalopathy and question of concern for seizures.  It was thought this might be secondary to urinary tract infection.  EEG showed global slowing and MRI revealed concern for recent seizure activity versus status epilepticus and also concern for encephalitis.  She was obtunded and responsive only to painful stimuli.  Baseline was awake and responsive although demented..  At [facility] telemedicine for neurology was consulted who recommended starting Keppra.  Felt delirium or seizure she could be the cause of her symptoms.  Her neurologic condition including being unresponsive and flaccid plus MRI was suggestive of possible encephalitis.  A lumbar puncture would be needed for definitive diagnosis which could not be performed at [facility].  Dr. Leonel Ramsay on-call for neurology at [facility] was notified and he accepted the patient in transfer.  He will see her upon arrival for further evaluation.  With a concern  for encephalitis the patient was given a IV dose of acyclovir.  Treatment for urinary tract infection will continue with ertapenem.  The patient's condition and prognosis is poor  ED Course: Patient is being transferred from inpatient status to [facility]  Review of Systems: Patient is  obtunded and a review of systems could not be obtained  Past medical history obtained from [place] records hypertension history of recurrent urinary tract infection to arthritis anemia Beatties hypothyroid disease dementia schizophrenia.  Past surgical history reported  Social history based on medical record from [facility] patient was residing in a skilled care facility secondary to her dementia and multiple comorbidities as family members have been advised to have her transferred to [facility].   has no history on file for tobacco, alcohol, and drug.  Not on File  No family history on file.   Prior to Admission medications   Sensipar 30 mg p.o. daily, hydralazine 50 mg 3 times daily, magnesium oxide 400 mg daily, Flomax 0.4 mg daily   D3 1000 units daily  lisinopril 20 mg daily  DuoNeb nebulizers every  6 prn Multivitamin daily Terry supplement-house shake 9 protein liquid 60 mL 3 times daily Vitamin C 500 mg twice daily Folic acid 1 mg every morning Levothyroxine 175 mcg daily risperidone 1 mL p.o. twice daily zinc sulfate 220 mg p.o. daily    Physical Exam: Vitals:   05/16/2019 1429 04/26/2019 1709  BP: 124/72 137/78  Pulse: 96 88  Resp: 12 15    Constitutional: NAD, calm, comfortable Vitals:   04/24/2019 1429 04/26/2019 1709  BP: 124/72 137/78  Pulse: 96 88  Resp: 12 15   General: Elderly black female overweight lying in bed is unresponsive in no distress  eyes: PERRL, lids and conjunctivae normal.  Neck: normal, supple, no masses, no thyromegaly Respiratory: clear to auscultation bilaterally, no wheezing, . Normal respiratory effort. No accessory muscle use.  Cardiovascular: Regular rate and rhythm, no  murmurs / rubs / gallops. No extremity edema. 2+ pedal pulses. No carotid bruits.  Abdomen: Obese, no tenderness, no masses palpated. No hepatosplenomegaly. Bowel sounds positive.  Musculoskeletal: no clubbing / cyanosis. No joint deformity  upper and lower extremities. Good ROM, no contractures. Normal muscle tone.  Skin: Bandaged right heel over stage III or IV decubitus with black eschar, and over left heel over stage II ulcer, sponge dressing over center of the low back over small ulcers with largest being 4 cm in length stage 3, several minor skin lesions inner thighs that are superficial Neurologic: Obtunded with no response to verbal stimuli renal nerves II through XII: Normal facial symmetry deviation of the tongue has equal eye movements but not to direction, pupils appear equal.  Did not check for tongue deviation or gag reflex.  Motor strength patient is generally flaccid in all 4 extremities DTRs could not be elicited.  Patient withdraws to painful stimulus Psychiatric: Could not assess due to patient's obtunded state   Labs on Admission: I have personally reviewed following labs and imaging studies Laboratory reports from end of hospital were reviewed: 05/05/2019- COVID negative, and ammonia level less than 9 05/04/19 -TSH 7.82, c-Met normal except for potassium of 3.4.  Note liver functions were normal.  Total protein 6.2, albumin 2.4.  White count 4300 hemoglobin 9.1 g, hematocrit 29.2, platelet count 269,000.  UA with too many white blood cells to count disease 20-30 blood cultures from 05/04/2023 no growth at 24 hours blood cultures from 05/06/2019 no growth at 24 hours.   Nasal swab 05/05/2019 was positive for MRSA urine drug screen 05/05/2019 was negative c-Met from 05/06/2019 was negative  CBC from 05/06/2019 with a hemoglobin dropped to 7.7, hematocrit 24.1 white count of 7400 CBC 05/19/2019 with BBC 5.5 hemoglobin 8.1 g, hematocrit 25.3, platelet count [facility] Chest x-ray 05/03/2009 with new left basilar airspace opacity concerning for developing pneumonia versus aspiration.  Blunting of the left costophrenic angle suggestive of trace left-sided pleural effusion CT head without contrast space  05/04/2019 PresSsion motion degraded exam.  No definite acute intracranial abnormality detected.  Persistent opacification of bilateral mastoid air cells similar to prior study. MRI head without contrast 1320 pression treated diffusion involving bilateral cerebral cortex and thalami in a largely symmetric pattern without significant swelling.  This is favored to reflect the sequelae of recent seizure activity versus status seizure.  Early encephalitis is less likely consideration unless her clinical signs of infection in which case lumbar puncture would be recommended.  EEG- port from cardiopulmonary services [facility] impression was mild diffuse slow wave abnormality indicating slight generalized neurophysiologic disturbance.  This may be in part postictal.  This can also be seen in patients with toxic or metabolic encephalopathy or in patients with primary neurodegenerative disorders.  EKG performed at [facility] reveals a sinus rhythm junctional changes.  Possible anterior infarct age undetermined  CBC: No results for input(s): WBC, NEUTROABS, HGB, HCT, MCV, PLT in the last 168 hours. Basic Metabolic Panel: No results for input(s): NA, K, CL, CO2, GLUCOSE, BUN, CREATININE, CALCIUM, MG, PHOS in the last 168 hours. GFR: CrCl cannot be calculated (No successful lab value found.). Liver Function Tests: No results for input(s): AST, ALT, ALKPHOS, BILITOT, PROT, ALBUMIN in the last 168 hours. No results for input(s): LIPASE, AMYLASE in the last 168 hours. No results for input(s): AMMONIA in the last 168 hours. Coagulation Profile: No results for input(s): INR, PROTIME in the last  168 hours. Cardiac Enzymes: No results for input(s): CKTOTAL, CKMB, CKMBINDEX, TROPONINI in the last 168 hours. BNP (last 3 results) No results for input(s): PROBNP in the last 8760 hours. HbA1C: No results for input(s): HGBA1C in the last 72 hours. CBG: No results for input(s): GLUCAP in the last  168 hours. Lipid Profile: No results for input(s): CHOL, HDL, LDLCALC, TRIG, CHOLHDL, LDLDIRECT in the last 72 hours. Thyroid Function Tests: No results for input(s): TSH, T4TOTAL, FREET4, T3FREE, THYROIDAB in the last 72 hours. Anemia Panel: No results for input(s): VITAMINB12, FOLATE, FERRITIN, TIBC, IRON, RETICCTPCT in the last 72 hours. Urine analysis: No results found for: COLORURINE, APPEARANCEUR, LABSPEC, PHURINE, GLUCOSEU, HGBUR, BILIRUBINUR, KETONESUR, PROTEINUR, UROBILINOGEN, NITRITE, LEUKOCYTESUR  Radiological Exams on Admission: No results found.  EKG: Independently reviewed.  See above  Assessment/Plan Active Problems:   Acute alteration in mental status   Sepsis due to urinary tract infection (HCC)   Type 2 diabetes mellitus with hyperlipidemia (HCC)   Hypothyroidism   Schizophrenia (HCC)   PNA (pneumonia)   Anemia in other chronic diseases classified elsewhere   AMS (altered mental status)   1.  Alteration in mental status -elderly demented patient with multiple comorbidities including recent bout of sepsis still receiving vancomycin as an outpatient prior to admission 6/12 at [facility]  Patient presented with acute mental status changes including being unresponsive and flaccid.  Patients evaluation is inconclusive although there is a concern for possible encephalitis versus delirium related to sepsis versus other.  There is good evidence that she has had seizures.  consultation from tele-neurology at [facility] favor the latter. Plan Kirkpatrick for the neurology service has been notified of the patient's admission.  Will guide the evaluation at this point.  Acyclovir 1 g IV was administered for potential encephalitis.  We will continue with acyclovir IV  Given the patient's obtunded state she is made n.p.o. will be supported with IV fluids.  Continue IV Keppra 2.  Sepsis secondary to ESBL urinary tract infection-diagnosis was established around off  hospital.  Patient has been started on ertapenem 1 g every 24 Plan will continue ertapenem at this time  Follow-up CBC in a.m.   Follow-up UA in several days to check for clearance of infection  3.  Pneumonia patient does have abnormality on chest x-ray that could be an infiltrate versus aspiration.  He has been afebrile and has had no elevation in white blood count mitigating against active pneumonia. Plan follow-up portable chest x-ray  Coverage with above antibiotic  Will keep head of bed elevated to 30 degrees to reduce risk of aspiration  4.  Diabetes -patient currently n.p.o.  Glucose levels have been adequately controlled Plan CBG monitoring with sliding scale  5.  Hypothyroidism- TSH was minimally elevated at 7.25.  Patient currently n.p.o. Plan will hold medication for several days.  Patient can take p.o.'s will resume home medication  6.  Anemia with no evidence of blood loss.  Suspect anemia of chronic disease.  By outside laboratory the patient's hemoglobin has been stable Plan follow-up CBC in a.m.   6.  Schizophrenia -records indicate the patient has been stable.  At this time we will hold her medications.  7. Skin breakdown - at admission patient had multiple areas of skin breakdown including  relatively advanced decubitus ulcer left heel right heel and mid back. Plan order placed for nursing to perform wound care  Wound care consult if needed  DVT prophylaxis: Lovenox  Code Status: DNR-comfort  care.  MOST form is on the chart.  Patient is for IV fluids as needed (Full/Partial (specify details) Family Communication: Transferring physician had spoken with the family who is aware of her transfer to [facility] hospital in her condition  Disposition Plan: SNF if patient is able to be discharged Consults called: Neurology service Dr. Leonel Ramsay (with names) Admission status: Inpatient admission   Adella Hare MD [facility] Pager [phone]  If 7PM-7AM, please  contact night-coverage [email or web address] Password [facility]  04/24/2019, 5:21 PM

## 2019-05-07 NOTE — Progress Notes (Signed)
WOUNDS PRESENT ON ADMISSION (LxWxD) Proximal R groin (11x0.5x0cm) Distal L groin (7.5x0.5x0cm) R Upper inner thigh proximal (1.5x0.6x0cm) R Upper inner thigh distal (1.1x0.4x0cm) L medial heel UNSTAGEABLE (4x3.5x0.7cm) R ankle (3.1x2.1x0cm) R medial heel (2.1x2.2x0cm) R outer heel (1.2x0.8x0cm) L buttocks STAGE 3 (5x3x0.3cm) Mid upper buttocks (4.5x1x0cm) R Proximal buttocks (1x0.5x0cm) R distal buttocks (2x1.2x0cm) R lateral buttocks (0.5x0.3x0cm)  All wounds were assessed by this RN and Lannette Donath. All wounds were cleansed and dressed on assessment.

## 2019-05-07 NOTE — Consult Note (Addendum)
NEURO HOSPITALIST CONSULT NOTE   Requesting physician: Dr. Linda Hedges  Reason for Consult: Encephalopathy  History obtained from:   Chart     HPI:                                                                                                                                          Erica Juarez is an 78 y.o. female with dementia who presented to [facility] in transfer from [facility] today for evaluation of possible encephalitis. She has a history of schizophrenia, HTN, hypothyroidism and DM. She has been residing in a skilled care facility secondary to her dementia and multiple comorbidities. She had initially been hospitalized recently at OSH for about a week for treatment of UTI, sepsis and C.difficile colitis. She was discharged to SNF but returned to [facility] with symptoms concerning for seizures and encephalopathy, initially thought to be secondary to UTI, a significant drop from her baseline of being awake and responsive. EEG at [facility] showed global slowing. MRI revealed strikingly symmetric hyperintense signal abnormality on DWI within the cortical grey matter of the medial frontal lobes as well as the insular cortices bilaterally; the findings were read by Radiology as being concerning for recent seizure activity versus status epilepticus versus encephalitis. At the OSH she was obtunded and responsive to painful stimuli only. Telemedicine was consulted and she was loaded with Keppra; the Teleneurologist's DDx included delirium and seizure. A lumbar puncture was recommended. She received a dose of IV acyclovir for possible HSV encephalitis.   On my review of the MRI, the DWI findings are also concerning for a possible autoimmune encephalopathy or paraneoplastic encephalitis.    PMHx HTN Recurrent UTI Arthritis Anemia Hypothyroidism Dementia Schizophrenia   No family history on file.            Social History:  has no history on file for tobacco, alcohol, and drug.  Not on  File  MEDICATIONS:                                                                                                                     Prior to Admission:  Medications Prior to Admission  Medication Sig Dispense Refill Last Dose  . cholecalciferol (VITAMIN D3) 25 MCG (1000 UT) tablet Take 1,000 Units by mouth  daily.     . cinacalcet (SENSIPAR) 30 MG tablet Take 30 mg by mouth daily.     . folic acid (FOLVITE) 1 MG tablet Take 1 mg by mouth daily.     . hydrALAZINE (APRESOLINE) 50 MG tablet Take 50 mg by mouth 3 (three) times daily.     Marland Kitchen. ipratropium-albuterol (DUONEB) 0.5-2.5 (3) MG/3ML SOLN Take 3 mLs by nebulization every 6 (six) hours as needed (shortness of breath).      Marland Kitchen. levothyroxine (SYNTHROID) 175 MCG tablet Take 175 mcg by mouth daily before breakfast.     . lisinopril (ZESTRIL) 20 MG tablet Take 20 mg by mouth daily.     . magnesium oxide (MAG-OX) 400 MG tablet Take 400 mg by mouth daily.     . Multiple Vitamin (MULTIVITAMIN WITH MINERALS) TABS tablet Take 1 tablet by mouth daily.     . Nutritional Supplements (NUTRITIONAL SHAKE PO) Take 120 mLs by mouth at bedtime. "house shake"     . RISPERIDONE PO Take by mouth.     . tamsulosin (FLOMAX) 0.4 MG CAPS capsule Take 0.4 mg by mouth daily.     . vitamin C (ASCORBIC ACID) 500 MG tablet Take 500 mg by mouth 2 (two) times daily.     Joan Flores. WELLNESS PROTEIN SHAKE PO Take 60 mLs by mouth 3 (three) times daily.     Marland Kitchen. zinc sulfate 220 (50 Zn) MG capsule Take 220 mg by mouth daily.      Scheduled: . enoxaparin (LOVENOX) injection  40 mg Subcutaneous Q24H  . [START ON 05/08/2019] insulin aspart  0-20 Units Subcutaneous TID WC   Continuous: . sodium chloride 50 mL/hr at 05/16/2019 1743  . acyclovir    . ertapenem    . levETIRAcetam       ROS:                                                                                                                                       Unable to obtain due to AMS.    Blood pressure 137/78, pulse 88,  resp. rate 15, weight 87.1 kg, SpO2 100 %.   General Examination:                                                                                                       Physical Exam  HEENT-  [place]/AT. Severe nuchal rigidity Lungs- Respirations unlabored  Extremities- Diffuse edema of BUE and BLE   Neurological Examination Mental Status:  Eyes open and gazing upwards with no responses to any verbal or tactile stimulation, but briefly tracked examiner's face. No attempts to communicate. 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 Does not localize to sternal rub. Does not withdraw or posture to noxious stimuli applied to any extremity.  Cranial Nerves: II: No blink to threat on repeated attempts. Left pupil reactive 3 mm >> 2 mm. Right pupil with hippus in response to light stimulation; a prominent cataract is also noted.   III,IV, VI: Eyes are open when examiner enters the room. Eyes are conjugately deviated upwards and slightly laterally, but will move to the midline with vocal stimulation, followed by slow roving EOM to left and right. Briefly tracks examiner's face. No nystagmus or forced gaze deviation noted.   V,VII: Mouth is held open with flaccid tone and asymmetry of NL folds but unclear if this constitutes a facial droop.  VIII: No response to voice IX,X: Unable to visualize palate. Patient does not vocalize XI: Unable to assess SS XII: Unable to assess Motor/Sensory: Does not move extremities to any stimuli. Increased extensor tone LUE.  Flaccid tone RUE Decreased tone BLE Deep Tendon Reflexes: Asymmetric upper extremities. 0 patellae bilaterally.  Plantars: Unable to assess due to dressings.  Cerebellar/Gait: Unable to assess    Lab Results: Basic Metabolic Panel: Recent Labs  Lab 05/20/2019 1652  CREATININE 0.50    CBC: Recent Labs  Lab 05/21/2019 1652  WBC 6.0  HGB 8.6*  HCT 29.1*  MCV 93.6  PLT 222    Cardiac Enzymes: No results for input(s):  CKTOTAL, CKMB, CKMBINDEX, TROPONINI in the last 168 hours.  Lipid Panel: No results for input(s): CHOL, TRIG, HDL, CHOLHDL, VLDL, LDLCALC in the last 168 hours.  Imaging: No results found.  Assessment: 78 year old female presenting with encephalopathy 1. 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 Severe nuchal rigidity is also noted.   2. Most likely etiology for her presentation is felt to be either a viral encephalitis or an autoimmune encephalopathy. The latter is felt to be more likely given the striking symmetry of the abnormal MRI findings, as well as the distribution (symmetric medial frontal lobe and insular cortical involvement is commonly seen in paraneoplastic/autoimmune encephalopathies). Also on DDx is seizure-related cortical hyperintensity, but this is usually asymmetric. Given history of hypothyroidism, DDx also includes Hashimoto encephalopathy.   Recommendations: 1. Lumbar puncture. Will obtain cell count with differential, protein, glucose, cryptococcal antigen, HSV PCR, CMV PCR, VZV PCR, IgG index, oligoclonal bands, gram stain, fungal stain, bacterial culture, fungal culture.  2. CT of chest, abdomen and pelvis to assess for possible malignancy 3. Start IV solumedrol 1000 mg qd x 5 days (ordered).  4. Repeat MRI brain with contrast in 6 days to assess for possible resolution of signal abnormalities following the full course of pulsed-dose steroids.  5. Continue IV acyclovir.     Electronically signed: Dr. Caryl PinaEric Dagon Budai 04/30/2019, 7:11 PM

## 2019-05-08 ENCOUNTER — Encounter (HOSPITAL_COMMUNITY): Payer: Self-pay | Admitting: *Deleted

## 2019-05-08 ENCOUNTER — Inpatient Hospital Stay (HOSPITAL_COMMUNITY): Payer: Medicare Other

## 2019-05-08 DIAGNOSIS — L8990 Pressure ulcer of unspecified site, unspecified stage: Secondary | ICD-10-CM | POA: Insufficient documentation

## 2019-05-08 DIAGNOSIS — R9389 Abnormal findings on diagnostic imaging of other specified body structures: Secondary | ICD-10-CM

## 2019-05-08 DIAGNOSIS — G039 Meningitis, unspecified: Secondary | ICD-10-CM

## 2019-05-08 DIAGNOSIS — R400 Somnolence: Secondary | ICD-10-CM

## 2019-05-08 LAB — COMPREHENSIVE METABOLIC PANEL
ALT: 15 U/L (ref 0–44)
AST: 20 U/L (ref 15–41)
Albumin: 1.6 g/dL — ABNORMAL LOW (ref 3.5–5.0)
Alkaline Phosphatase: 76 U/L (ref 38–126)
Anion gap: 8 (ref 5–15)
BUN: 7 mg/dL — ABNORMAL LOW (ref 8–23)
CO2: 27 mmol/L (ref 22–32)
Calcium: 8.3 mg/dL — ABNORMAL LOW (ref 8.9–10.3)
Chloride: 105 mmol/L (ref 98–111)
Creatinine, Ser: 0.43 mg/dL — ABNORMAL LOW (ref 0.44–1.00)
GFR calc Af Amer: >60 mL/min (ref >60)
GFR calc non Af Amer: >60 mL/min (ref >60)
Glucose, Bld: 199 mg/dL — ABNORMAL HIGH (ref 70–99)
Potassium: 3.2 mmol/L — ABNORMAL LOW (ref 3.5–5.1)
Sodium: 140 mmol/L (ref 135–145)
Total Bilirubin: 0.4 mg/dL (ref 0.3–1.2)
Total Protein: 5.9 g/dL — ABNORMAL LOW (ref 6.5–8.1)

## 2019-05-08 LAB — CBC
HCT: 28.9 % — ABNORMAL LOW (ref 36.0–46.0)
Hemoglobin: 8.7 g/dL — ABNORMAL LOW (ref 12.0–15.0)
MCH: 27.6 pg (ref 26.0–34.0)
MCHC: 30.1 g/dL (ref 30.0–36.0)
MCV: 91.7 fL (ref 80.0–100.0)
Platelets: 290 10*3/uL (ref 150–400)
RBC: 3.15 MIL/uL — ABNORMAL LOW (ref 3.87–5.11)
RDW: 19.2 % — ABNORMAL HIGH (ref 11.5–15.5)
WBC: 4.3 10*3/uL (ref 4.0–10.5)
nRBC: 0 % (ref 0.0–0.2)

## 2019-05-08 LAB — HEMOGLOBIN A1C
Hgb A1c MFr Bld: 4.7 % — ABNORMAL LOW (ref 4.8–5.6)
Mean Plasma Glucose: 88.19 mg/dL

## 2019-05-08 LAB — GLUCOSE, CAPILLARY
Glucose-Capillary: 204 mg/dL — ABNORMAL HIGH (ref 70–99)
Glucose-Capillary: 157 mg/dL — ABNORMAL HIGH (ref 70–99)
Glucose-Capillary: 144 mg/dL — ABNORMAL HIGH (ref 70–99)
Glucose-Capillary: 131 mg/dL — ABNORMAL HIGH (ref 70–99)

## 2019-05-08 LAB — MRSA PCR SCREENING: MRSA by PCR: POSITIVE — AB

## 2019-05-08 MED ORDER — LORAZEPAM 2 MG/ML IJ SOLN
2.0000 mg | Freq: Once | INTRAMUSCULAR | Status: AC
  Administered 2019-05-08: 14:00:00 2 mg via INTRAVENOUS

## 2019-05-08 MED ORDER — SODIUM CHLORIDE 0.9 % IV SOLN
2.0000 g | Freq: Two times a day (BID) | INTRAVENOUS | Status: DC
  Administered 2019-05-08: 2 g via INTRAVENOUS
  Filled 2019-05-08 (×2): qty 20

## 2019-05-08 MED ORDER — SODIUM CHLORIDE 0.9 % IV SOLN
2.0000 g | Freq: Three times a day (TID) | INTRAVENOUS | Status: DC
  Administered 2019-05-08 – 2019-05-09 (×3): 2 g via INTRAVENOUS
  Filled 2019-05-08 (×5): qty 2

## 2019-05-08 MED ORDER — LORAZEPAM 2 MG/ML IJ SOLN
INTRAMUSCULAR | Status: AC
  Filled 2019-05-08: qty 1

## 2019-05-08 MED ORDER — MUPIROCIN 2 % EX OINT
TOPICAL_OINTMENT | Freq: Every day | CUTANEOUS | Status: DC
  Administered 2019-05-08 – 2019-05-10 (×3): via TOPICAL
  Filled 2019-05-08: qty 22

## 2019-05-08 MED ORDER — LEVETIRACETAM IN NACL 1500 MG/100ML IV SOLN
1500.0000 mg | INTRAVENOUS | Status: AC
  Administered 2019-05-08: 1500 mg via INTRAVENOUS
  Filled 2019-05-08: qty 100

## 2019-05-08 MED ORDER — VANCOMYCIN HCL 10 G IV SOLR
1500.0000 mg | Freq: Once | INTRAVENOUS | Status: AC
  Administered 2019-05-08: 09:00:00 1500 mg via INTRAVENOUS
  Filled 2019-05-08: qty 1500

## 2019-05-08 MED ORDER — VANCOMYCIN HCL IN DEXTROSE 1-5 GM/200ML-% IV SOLN
1000.0000 mg | Freq: Two times a day (BID) | INTRAVENOUS | Status: DC
  Administered 2019-05-08 – 2019-05-09 (×2): 1000 mg via INTRAVENOUS
  Filled 2019-05-08 (×2): qty 200

## 2019-05-08 MED ORDER — SODIUM CHLORIDE 0.9 % IV SOLN
2.0000 g | INTRAVENOUS | Status: DC
  Administered 2019-05-08 (×2): 2 g via INTRAVENOUS
  Filled 2019-05-08 (×4): qty 2000

## 2019-05-08 NOTE — Progress Notes (Addendum)
PROGRESS NOTE    Erica Juarez  GUR:[id number] DOB: 07-12-1941 DOA: 05/21/2019 PCP: Patient, No Pcp Per  Brief Narrative:78 y.o. female with medical history significant of schizophrenia, diabetes, hypertension, hypothyroid disease.  She was hospitalized at [facility] for approximately 1 week secondary to UTI sepsis and subsequent C. difficile.  Return to [facility] from SNF secondary to encephalopathy and question of concern for seizures.  It was thought this might be secondary to urinary tract infection.  EEG showed global slowing and MRI revealed concern for recent seizure activity versus status epilepticus and also concern for encephalitis.  She was obtunded and responsive only to painful stimuli.  Baseline was awake and responsive although demented..  At [facility] telemedicine for neurology was consulted who recommended starting Keppra.  Felt delirium or seizure she could be the cause of her symptoms.  Her neurologic condition including being unresponsive and flaccid plus MRI was suggestive of possible encephalitis.  A lumbar puncture would be needed for definitive diagnosis which could not be performed at [facility].  Dr. Leonel Ramsay on-call for neurology at [facility] was notified and he accepted the patient in transfer.  He will see her upon arrival for further evaluation.  With a concern  for encephalitis the patient was given a IV dose of acyclovir.  Treatment for urinary tract infection will continue with ertapenem.  The patient's condition and prognosis is poor  ED Course: Patient is being transferred from inpatient status to [facility]  Review of Systems: Patient is obtunded and a review of systems could not be obtained   Assessment & Plan:   Active Problems:   Acute alteration in mental status   Sepsis due to urinary tract infection (HCC)   Type 2 diabetes mellitus with hyperlipidemia (HCC)   Hypothyroidism   Schizophrenia (HCC)   AMS (altered  mental status)   PNA (pneumonia)   Anemia in other chronic diseases classified elsewhere   Pressure injury of skin  #1 acute encephalopathy in the setting of 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  She has history of dementia and lives in a nursing home in [place].  She was transferred to [facility] for further work-up of encephalitis versus meningitis as a reason for her encephalopathy.  At baseline she is awake and responds to some questions.  Patient followed by neurology.  Attempted lumbar puncture yesterday without success.  Fluoroscopy guided lumbar puncture ordered.  Differential diagnosis includes viral bacterial encephalitis or autoimmune encephalitis versus seizure induced hyperintensity, status epilepticus.  Neurology thinks it is most likely autoimmune.  However she is being covered for bacterial meningitis with vancomycin and Rocephin ampicillin.  She also is receiving acyclovir to cover viral encephalitis.  Neurology leaning towards autoimmune encephalopathy due to MRI findings of symmetric distribution of medial frontal lobe and insular cortical involvement seen in paraneoplastic and autoimmune encephalopathy.  EEG at [facility] showed global slowing.  MRI showed strikingly symmetric hyperintense signal abnormality within the cortical gray matter of the median frontal lobes as well as insular opacities bilaterally.  Her prognosis is extremely poor.   ADDENDUM HER NIECE WOULD LIKE TO COME VISIT TODAY.THEN SHE CAN BE CHANGED TO COMFORT CARE.Marland Kitchen #2 type 2 diabetes while in hospital cover with SSI patient is n.p.o. due to obtunded state on slow IV fluids.  #3 hypothyroidism TSH 7.25 very unlikely to be Hashimoto's encephalopathy.  #4 anemia of chronic disease follow-up H&H no active signs of bleed.  #5 ESBL urinary tract infection patient was being  treated with ertapenem I am not sure when ertapenem was started in the outside hospital.  Follow-up the urine  culture.  #6 abnormal chest x-ray the initial chest x-ray was concerning for infiltrate due to aspiration.  Repeat chest x-ray shows mild CHF.  However she is afebrile with no elevated white count.  I do not think she has pneumonia at this time however she has been covered with multiple antibiotics as above for different reasons.  She is 100% on 2 L of oxygen.  #7 history of schizophrenia and dementia continue supportive care  #8 left heel right heel and mid back decubitus ulcer present on admission continue wound care by nursing.   DVT prophylaxis with Lovenox  CODE STATUS patient is DNR comfort care. Family communication I discussed with her brother Ronal FearJesse Patterson who wants to be informed of any changes in her condition.  Disposition pending clinical improvement if no clinical improvement patient is a hospice candidate.  Consults called neurology  Antibiotics acyclovir for possible viral encephalitis Vancomycin and Rocephin ampicillin for possible bacterial encephalitis versus meningitis Ertapenem for ESBL UTI present [facility] to [facility]     Pressure Injury 05/20/2019 Heel Left;Posterior;Medial Unstageable - Full thickness tissue loss in which the base of the ulcer is covered by slough (yellow, tan, gray, green or brown) and/or eschar (tan, brown or black) in the wound bed. (Active)  05/22/2019 1430  Location: Heel  Location Orientation: Left;Posterior;Medial  Staging: Unstageable - Full thickness tissue loss in which the base of the ulcer is covered by slough (yellow, tan, gray, green or brown) and/or eschar (tan, brown or black) in the wound bed.  Wound Description (Comments):   Present on Admission: Yes     Pressure Injury 05/02/2019 Heel Right;Medial Unstageable - Full thickness tissue loss in which the base of the ulcer is covered by slough (yellow, tan, gray, green or brown) and/or eschar (tan, brown or black) in the wound bed. (Active)  05/05/2019 1430  Location: Heel   Location Orientation: Right;Medial  Staging: Unstageable - Full thickness tissue loss in which the base of the ulcer is covered by slough (yellow, tan, gray, green or brown) and/or eschar (tan, brown or black) in the wound bed.  Wound Description (Comments):   Present on Admission: Yes     Pressure Injury 05/16/2019 Heel Right;Lateral Stage II -  Partial thickness loss of dermis presenting as a shallow open ulcer with a red, pink wound bed without slough. (Active)  05/04/2019 1430  Location: Heel  Location Orientation: Right;Lateral  Staging: Stage II -  Partial thickness loss of dermis presenting as a shallow open ulcer with a red, pink wound bed without slough.  Wound Description (Comments):   Present on Admission: Yes     Pressure Injury 05/15/2019 Ankle Right;Lateral Stage I -  Intact skin with non-blanchable redness of a localized area usually over a bony prominence. (Active)  05/19/2019 1430  Location: Ankle  Location Orientation: Right;Lateral  Staging: Stage I -  Intact skin with non-blanchable redness of a localized area usually over a bony prominence.  Wound Description (Comments):   Present on Admission: Yes    There is no height or weight on file to calculate BMI.    Subjective:  Patient is unresponsive/obtunded no response to painful stimuli no corneal reflex Objective: Vitals:   04/24/2019 2330 05/08/19 0407 05/08/19 0500 05/08/19 0700  BP: 113/66 (!) 170/85  (!) 145/70  Pulse: 76   89  Resp: 15 15  Temp:   (!) 97.5 F (36.4 C) (!) 97.3 F (36.3 C)  TempSrc: Axillary Axillary Axillary Axillary  SpO2: 97% 97%  100%  Weight:        Intake/Output Summary (Last 24 hours) at 05/08/2019 0806 Last data filed at 05/08/2019 16100623 Gross per 24 hour  Intake 936.68 ml  Output 650 ml  Net 286.68 ml   Filed Weights   05/05/2019 1903  Weight: 87.1 kg    Examination:  General exam: Appears obtunded  respiratory system: Few scattered rhonchi to auscultation. Respiratory  effort normal. Cardiovascular system: S1 & S2 heard, RRR. No JVD, murmurs, rubs, gallops or clicks. No pedal edema. Gastrointestinal system: Abdomen is nondistended, soft and nontender. No organomegaly or masses felt. Normal bowel sounds heard. Central nervous system: Obtunded Extremities: Trace bilateral pitting edema skin: Multiple skin breakdown present prior to admission left heel right heel and mid back    Data Reviewed: I have personally reviewed following labs and imaging studies  CBC: Recent Labs  Lab 05/02/2019 1652  WBC 6.0  HGB 8.6*  HCT 29.1*  MCV 93.6  PLT 222   Basic Metabolic Panel: Recent Labs  Lab 05/19/2019 1652  CREATININE 0.50   GFR: CrCl cannot be calculated (Unknown ideal weight.). Liver Function Tests: No results for input(s): AST, ALT, ALKPHOS, BILITOT, PROT, ALBUMIN in the last 168 hours. No results for input(s): LIPASE, AMYLASE in the last 168 hours. No results for input(s): AMMONIA in the last 168 hours. Coagulation Profile: No results for input(s): INR, PROTIME in the last 168 hours. Cardiac Enzymes: No results for input(s): CKTOTAL, CKMB, CKMBINDEX, TROPONINI in the last 168 hours. BNP (last 3 results) No results for input(s): PROBNP in the last 8760 hours. HbA1C: No results for input(s): HGBA1C in the last 72 hours. CBG: Recent Labs  Lab 05/06/2019 2118 05/08/19 0622  GLUCAP 109* 204*   Lipid Profile: No results for input(s): CHOL, HDL, LDLCALC, TRIG, CHOLHDL, LDLDIRECT in the last 72 hours. Thyroid Function Tests: No results for input(s): TSH, T4TOTAL, FREET4, T3FREE, THYROIDAB in the last 72 hours. Anemia Panel: No results for input(s): VITAMINB12, FOLATE, FERRITIN, TIBC, IRON, RETICCTPCT in the last 72 hours. Sepsis Labs: No results for input(s): PROCALCITON, LATICACIDVEN in the last 168 hours.  Recent Results (from the past 240 hour(s))  C difficile quick scan w PCR reflex     Status: None   Collection Time: 04/30/2019  5:42 PM    Specimen: STOOL  Result Value Ref Range Status   C Diff antigen NEGATIVE NEGATIVE Final   C Diff toxin NEGATIVE NEGATIVE Final   C Diff interpretation No C. difficile detected.  Final    Comment: Performed at [facility], [address] [place], [place] [id number]         Radiology Studies: Dg Chest 2 View  Result Date: 04/30/2019 CLINICAL DATA:  [facility]; only responds to painful stimuli. Per MD notes: Chest x-ray 05/03/2009 with new left basilar airspace opacity concerning for developing pneumonia versus aspiration. Blunting of the left costop.*comment was truncated* EXAM: CHEST - 2 VIEW COMPARISON:  05/04/2019 FINDINGS: Enlarged cardiac silhouette. Low lung volumes. Pulmonary venous congestion present. No focal infiltrate. No pneumothorax. Small effusion noted on the lateral projection. IMPRESSION: Low lung volumes, small effusions, and venous congestion. Electronically Signed   By: Genevive BiStewart  Edmunds M.D.   On: 05/23/2019 19:28        Scheduled Meds:  enoxaparin (LOVENOX) injection  40 mg Subcutaneous Q24H  insulin aspart  0-20 Units Subcutaneous TID WC   Continuous Infusions:  sodium chloride 50 mL/hr at 05/08/19 0500   acyclovir 650 mg (05/08/19 0505)   ampicillin (OMNIPEN) IV     cefTRIAXone (ROCEPHIN)  IV     ertapenem Stopped (05/06/2019 2110)   levETIRAcetam Stopped (04/28/2019 2037)   methylPREDNISolone (SOLU-MEDROL) injection Stopped (04/27/2019 2322)   vancomycin     vancomycin       LOS: 1 day     Alwyn RenElizabeth G Kamron Vanwyhe, MD [facility]  If 7PM-7AM, please contact night-coverage [email or web address] Password [email or web address] 05/08/2019, 8:06 AM

## 2019-05-08 NOTE — Progress Notes (Signed)
EEG Complete  Results Pending 

## 2019-05-08 NOTE — Progress Notes (Addendum)
NEUROLOGY PROGRESS NOTE  Subjective: Patient is currently seizing.  Eyes deviated to the left with arm stiff on the left.  LTM is currently hooked up that shows frequent intermittent seizures-consistent with status epilepticus.  Exam: Vitals:   05/08/19 0700 05/08/19 1300  BP: (!) 145/70 (!) 156/65  Pulse: 89 78  Resp:    Temp: (!) 97.3 F (36.3 C) (!) 94 F (34.4 C)  SpO2: 100% 100%    Physical Exam   HEENT-  Normocephalic, no lesions, without obvious abnormality.  Normal external eye and conjunctiva.   Extremities- Warm, dry and intact Musculoskeletal-no joint tenderness, deformity or swelling Skin-warm and dry, no hyperpigmentation, vitiligo, or suspicious lesions    Neuro:  Mental Status: Patient currently seizing.  Follows no commands.  Does not blink to threat. Cranial Nerves: II: No blink to threat III,IV, VI: Eyes deviated to the left with forced gaze deviation. V,VII: Left face is drooping although her head is tilted to the left, there is no active twitching of the face. VIII: No response to voice I Motor: All extremities flaccid with the exception of the left arm when seizing.  Once he is in the left arm is extremely stiff and held out straight Sensory: No response to pain Deep Tendon Reflexes: No deep tendon reflexes appreciated however patient is extremely edematous     Medications:  Scheduled: . LORazepam      . enoxaparin (LOVENOX) injection  40 mg Subcutaneous Q24H  . insulin aspart  0-20 Units Subcutaneous TID WC  . LORazepam  2 mg Intravenous Once   Continuous: . sodium chloride 50 mL/hr at 05/08/19 0500  . acyclovir 650 mg (05/08/19 0505)  . levETIRAcetam 1,500 mg (05/08/19 1406)  . levETIRAcetam 500 mg (05/08/19 0906)  . meropenem (MERREM) IV    . methylPREDNISolone (SOLU-MEDROL) injection Stopped (2019-05-29 2322)  . 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  Patient was immediately given 2 mg of Ativan and 1500 mg of Keppra bolus was given.  Discussion with niece was had.  During that time she was told about the ongoing seizures and possible etiology of her seizures.  It was explained to daughter about the poor medical condition that she is in.  It was also discussed with her the fact that we are giving her multiple medications to abate the seizures, as well as the risk for possible significant sedation that could cause the need for intubation.  This was made secondary to the fact patient is a DNR.  Family has decided to go comfort care.     Recommendations: Continue current AED Comfort care Use Ativan as needed as a part of the comfort care order set. Plan communicated to Dr. Lonia Blood.   05/08/2019, 2:04 PM  -- Amie Portland, MD [facility] Pager: [phone] If 7pm to 7am, please call on call as listed on AMION.

## 2019-05-08 NOTE — Progress Notes (Signed)
Pharmacy Antibiotic Note  Erica Juarez is a 78 y.o. female admitted on 05/09/2019 with concern for meningitis.  Pharmacy has been consulted to broaden coverage for bacterial meningitis with vancomycin.  Plan: Vancomycin 1500mg  IV x1 now then 1000mg  IV every 12 hours.  Goal trough 15-20 mcg/mL.  Also started ampicillin 2g IV Q4H and Rocephin 2g IV Q12H per conversation with neurologist Dr Cheral Marker.  Weight: 192 lb 0.3 oz (87.1 kg)  Temp (24hrs), Avg:97.9 F (36.6 C), Min:97.5 F (36.4 C), Max:98.2 F (36.8 C)  Recent Labs  Lab 05/18/2019 1652  WBC 6.0  CREATININE 0.50    Thank you for allowing pharmacy to be a part of this patient's care.  Wynona Neat, PharmD, BCPS  05/08/2019 7:32 AM

## 2019-05-08 NOTE — Progress Notes (Signed)
Pt seen to have increased work of breathing. RR 18. O2 91%, placed on 2L [place]. Pt lung sounds have increased crackles. Dr. Rodena Piety notified. Nurse will continue to monitor. [place]

## 2019-05-08 NOTE — Progress Notes (Addendum)
   Preliminary EEG: Status epilepticus  I spoke in detail with the patient's niece who is also the power of attorney. Quintella Baton, niece [phone]   Explained to her the MRI findings, the general debility that the patient has and the fact that she has seizures both clinically and on EEG and possible status epilepticus. The treatment of this would entail putting her to have a sedation under intubation in the ICU. The patient's niece informed me that the patient would definitely not want that done and would like to go for comfort measures at this time.  -- Amie Portland, MD [facility] Pager: [phone] If 7pm to 7am, please call on call as listed on AMION.

## 2019-05-08 NOTE — Consult Note (Signed)
[place] Nurse wound consult note Reason for Consult: Unstageable pressure injuries to bilateral heels and mid back.  All present on admission. Lives in SNF.  Wound type: Unstageable pressure injuries.  Pressure Injury POA: Yes Measurement: MId back:  2.5 cm x 2 cm eschar, edges lifted and drainage note.d  LEft heel:  4 cm x 4 cm eschar to wound bed. Right heel:  2.2 cm x 2.2 cm eschar Right lateral heel:  1 cm x  Cm  Eschar is lifting at the edges and debridement does not seem appropriate at this time. Albumin is 1.4 and family wishes her to be comfort care.  Will begin daily antimicrobial ointment and dry dressing along with offloading pressure.  Wound bed:100% devitalized tissue Drainage (amount, consistency, odor) minimal serosanguinous  Musty odor Periwound:dry skin Dressing procedure/placement/frequency:Cleanse bilateral heel wounds and back wound with NS and pat dry.  Apply mupirocin ointment to wound bed. Cover with foam dressing.  Change daily.  Turn and reposition every two hours.  Will not follow at this time.  Please re-consult if needed.  Domenic Moras MSN, RN, FNP-BC CWON Wound, Ostomy, Continence Nurse Pager [phone]

## 2019-05-08 NOTE — Progress Notes (Signed)
Although bacterial meningitis is felt to be low on the DDx, will start empiric vancomycin, ceftriaxone and ampicillin with pharmacy assistance.   Electronically signed: Dr. Kerney Elbe

## 2019-05-08 NOTE — Procedures (Signed)
  [place] A. Merlene Laughter, MD     [email or web address]           HISTORY: This is a 78 year old lady who presents with confusion and episodes of jerking suspicious for seizures.  MEDICATIONS:  Current Facility-Administered Medications:  .  0.45 % sodium chloride infusion, , Intravenous, Continuous, Norins, Heinz Knuckles, MD, Last Rate: 50 mL/hr at 05/08/19 1421 .  acyclovir (ZOVIRAX) 650 mg in dextrose 5 % 100 mL IVPB, 650 mg, Intravenous, Q8H, Norins, Heinz Knuckles, MD, Last Rate: 113 mL/hr at 05/08/19 1415, 650 mg at 05/08/19 1415 .  enoxaparin (LOVENOX) injection 40 mg, 40 mg, Subcutaneous, Q24H, Norins, Heinz Knuckles, MD, 40 mg at 05/06/2019 1724 .  insulin aspart (novoLOG) injection 0-20 Units, 0-20 Units, Subcutaneous, TID WC, Norins, Heinz Knuckles, MD, 4 Units at 05/08/19 1411 .  levETIRAcetam (KEPPRA) IVPB 500 mg/100 mL premix, 500 mg, Intravenous, Q12H, Norins, Heinz Knuckles, MD, Last Rate: 400 mL/hr at 05/08/19 0906, 500 mg at 05/08/19 0906 .  LORazepam (ATIVAN) 2 MG/ML injection, , , ,  .  meropenem (MERREM) 2 g in sodium chloride 0.9 % 100 mL IVPB, 2 g, Intravenous, Q8H, Georgette Shell, MD .  methylPREDNISolone sodium succinate (SOLU-MEDROL) 1,000 mg in sodium chloride 0.9 % 50 mL IVPB, 1,000 mg, Intravenous, Q24H, Kerney Elbe, MD, Stopped at 05/06/2019 2322 .  mupirocin ointment (BACTROBAN) 2 %, , Topical, Daily, Georgette Shell, MD .  vancomycin (VANCOCIN) IVPB 1000 mg/200 mL premix, 1,000 mg, Intravenous, Q12H, Bryk, Veronda P, RPH     ANALYSIS: A 16 channel recording using standard 10 20 measurements is conducted for 25 minutes.   The background activity gets as high as 7 hertz.  There is beta activity observed in the frontal areas.  Almost immediately after the initiation of the recording, the patient is noted to have left frontal temporal spike activity of about 7 hertz with varying frequency and spreading to the contralateral side.  This is associated with post  ictal slowing in the theta range although some delta slowing is also noted briefly.  The patient is noted to have about 4 these events sometimes associated with the jerking.  Photic stimulation is conducted and may have induced a couple of these events.    IMPRESSION: 1.  Repeat electrographic seizures consistent with status epilepticus. 2.  Mild global slowing is also noted.      Jesusita Jocelyn A. Merlene Laughter, M.D.  Diplomate, Tax adviser of Psychiatry and Neurology ( Neurology).

## 2019-05-09 ENCOUNTER — Inpatient Hospital Stay (HOSPITAL_COMMUNITY): Payer: Medicare Other

## 2019-05-09 DIAGNOSIS — B59 Pneumocystosis: Secondary | ICD-10-CM

## 2019-05-09 DIAGNOSIS — L8940 Pressure ulcer of contiguous site of back, buttock and hip, unspecified stage: Secondary | ICD-10-CM

## 2019-05-09 DIAGNOSIS — E039 Hypothyroidism, unspecified: Secondary | ICD-10-CM

## 2019-05-09 DIAGNOSIS — F209 Schizophrenia, unspecified: Secondary | ICD-10-CM

## 2019-05-09 LAB — CBC WITH DIFFERENTIAL/PLATELET
Abs Immature Granulocytes: 0.08 10*3/uL — ABNORMAL HIGH (ref 0.00–0.07)
Basophils Absolute: 0 10*3/uL (ref 0.0–0.1)
Basophils Relative: 0 %
Eosinophils Absolute: 0 10*3/uL (ref 0.0–0.5)
Eosinophils Relative: 0 %
HCT: 27.9 % — ABNORMAL LOW (ref 36.0–46.0)
Hemoglobin: 8.5 g/dL — ABNORMAL LOW (ref 12.0–15.0)
Immature Granulocytes: 3 %
Lymphocytes Relative: 12 %
Lymphs Abs: 0.4 10*3/uL — ABNORMAL LOW (ref 0.7–4.0)
MCH: 27.9 pg (ref 26.0–34.0)
MCHC: 30.5 g/dL (ref 30.0–36.0)
MCV: 91.5 fL (ref 80.0–100.0)
Monocytes Absolute: 0 10*3/uL — ABNORMAL LOW (ref 0.1–1.0)
Monocytes Relative: 1 %
Neutro Abs: 2.7 10*3/uL (ref 1.7–7.7)
Neutrophils Relative %: 84 %
Platelets: 251 10*3/uL (ref 150–400)
RBC: 3.05 MIL/uL — ABNORMAL LOW (ref 3.87–5.11)
RDW: 18.7 % — ABNORMAL HIGH (ref 11.5–15.5)
WBC: 3.2 10*3/uL — ABNORMAL LOW (ref 4.0–10.5)
nRBC: 0.6 % — ABNORMAL HIGH (ref 0.0–0.2)

## 2019-05-09 LAB — URINE CULTURE: Culture: NO GROWTH

## 2019-05-09 LAB — GLUCOSE, CAPILLARY
Glucose-Capillary: 136 mg/dL — ABNORMAL HIGH (ref 70–99)
Glucose-Capillary: 140 mg/dL — ABNORMAL HIGH (ref 70–99)

## 2019-05-09 MED ORDER — MORPHINE SULFATE (CONCENTRATE) 10 MG/0.5ML PO SOLN: 5.0000 mg | ORAL | Status: DC | PRN

## 2019-05-09 MED ORDER — LORAZEPAM 2 MG/ML IJ SOLN
1.0000 mg | INTRAMUSCULAR | Status: DC | PRN
  Administered 2019-05-09: 1 mg via INTRAVENOUS
  Filled 2019-05-09: qty 1

## 2019-05-09 MED ORDER — PROCHLORPERAZINE 25 MG RE SUPP
25.0000 mg | Freq: Two times a day (BID) | RECTAL | Status: DC | PRN
  Filled 2019-05-09: qty 1

## 2019-05-09 MED ORDER — ACETAMINOPHEN 325 MG PO TABS: 650.0000 mg | ORAL_TABLET | Freq: Four times a day (QID) | ORAL | Status: DC | PRN

## 2019-05-09 MED ORDER — SENNA 8.6 MG PO TABS: 1.0000 | ORAL_TABLET | Freq: Every evening | ORAL | Status: DC | PRN

## 2019-05-09 MED ORDER — LORAZEPAM 2 MG/ML IJ SOLN
2.0000 mg | INTRAMUSCULAR | Status: DC | PRN
  Administered 2019-05-10: 08:00:00 2 mg via INTRAVENOUS
  Filled 2019-05-09: qty 1

## 2019-05-09 MED ORDER — PROCHLORPERAZINE MALEATE 5 MG PO TABS
5.0000 mg | ORAL_TABLET | Freq: Four times a day (QID) | ORAL | Status: DC | PRN
  Filled 2019-05-09: qty 1

## 2019-05-09 MED ORDER — ACETAMINOPHEN 650 MG RE SUPP: 650.0000 mg | Freq: Four times a day (QID) | RECTAL | Status: DC | PRN

## 2019-05-09 MED ORDER — LORAZEPAM 2 MG/ML PO CONC: 1.0000 mg | ORAL | Status: DC | PRN

## 2019-05-09 MED ORDER — LORAZEPAM 1 MG PO TABS: 1.0000 mg | ORAL_TABLET | ORAL | Status: DC | PRN

## 2019-05-09 MED ORDER — PROCHLORPERAZINE EDISYLATE 10 MG/2ML IJ SOLN: 10.0000 mg | Freq: Two times a day (BID) | INTRAMUSCULAR | Status: DC | PRN

## 2019-05-09 MED ORDER — BIOTENE DRY MOUTH MT LIQD: 15.0000 mL | OROMUCOSAL | Status: DC | PRN

## 2019-05-09 MED ORDER — TEMAZEPAM 7.5 MG PO CAPS: 7.5000 mg | ORAL_CAPSULE | Freq: Every evening | ORAL | Status: DC | PRN

## 2019-05-09 MED ORDER — POLYVINYL ALCOHOL 1.4 % OP SOLN
1.0000 [drp] | Freq: Four times a day (QID) | OPHTHALMIC | Status: DC | PRN
  Filled 2019-05-09: qty 15

## 2019-05-09 NOTE — Progress Notes (Signed)
PROGRESS NOTE  Erica Juarez GYF:[id number] DOB: 1941-01-09 DOA: 04/26/2019 PCP: Patient, No Pcp Per  Brief History   78 y.o.femalewith medical history significant ofschizophrenia, diabetes, hypertension, hypothyroid disease. She was hospitalized at [facility] for approximately 1 week secondary to UTI sepsis and subsequent C. difficile. [facility] to encephalopathy and question of concern for seizures. It was thought this might be secondary to urinary tract infection. EEG showed global slowing and MRI revealed concern for recent seizure activity versus status epilepticus and also concern for encephalitis. She was obtunded and responsive only to painful stimuli. Baseline was awake and responsive although demented..At [facility] telemedicine for neurology was consulted who recommended starting Keppra. Felt delirium or seizureshe could be the cause of her symptoms. Her neurologic condition including being unresponsive and flaccid plus MRI was suggestive of possible encephalitis. A lumbar puncture would be needed for definitive diagnosis which could not be performed at [facility]. Dr. Leonel Ramsay on-call for neurology at [facility] was notified and he accepted the patient in transfer. He will see her upon arrival for further evaluation. With a concern for encephalitis the patient was given a IV dose of acyclovir. Treatment for urinary tract infection will continue with ertapenem.The patient's condition and prognosis is poor.  Patient is being transferred from inpatient status to [facility].  The patient was admitted to a medical bed. Neurology was consulted. Overnight she declined. Neurology spoke with the family who decided to make the patient comfort care.  Comfort care orders have been placed. She is DNR.   Consultants   Neurology  Procedures   None  Antibiotics   Anti-infectives (From admission, onward)    Start     Dose/Rate Route Frequency Ordered Stop   05/08/19 1800  vancomycin (VANCOCIN) IVPB 1000 mg/200 mL premix  Status:  Discontinued     1,000 mg 200 mL/hr over 60 Minutes Intravenous Every 12 hours 05/08/19 0737 05/09/19 0947   05/08/19 1400  meropenem (MERREM) 2 g in sodium chloride 0.9 % 100 mL IVPB  Status:  Discontinued     2 g 200 mL/hr over 30 Minutes Intravenous Every 8 hours 05/08/19 1339 05/09/19 0947   05/08/19 1000  cefTRIAXone (ROCEPHIN) 2 g in sodium chloride 0.9 % 100 mL IVPB  Status:  Discontinued     2 g 200 mL/hr over 30 Minutes Intravenous Every 12 hours 05/08/19 0730 05/08/19 1339   05/08/19 0800  ampicillin (OMNIPEN) 2 g in sodium chloride 0.9 % 100 mL IVPB  Status:  Discontinued     2 g 300 mL/hr over 20 Minutes Intravenous Every 4 hours 05/08/19 0730 05/08/19 1339   05/08/19 0745  vancomycin (VANCOCIN) 1,500 mg in sodium chloride 0.9 % 500 mL IVPB     1,500 mg 250 mL/hr over 120 Minutes Intravenous  Once 05/08/19 0737 05/08/19 1208   05/10/2019 2100  acyclovir (ZOVIRAX) 650 mg in dextrose 5 % 100 mL IVPB  Status:  Discontinued     650 mg 113 mL/hr over 60 Minutes Intravenous Every 8 hours 05/10/2019 1748 05/09/19 0947   05/06/2019 2000  ertapenem (INVANZ) 1,000 mg in sodium chloride 0.9 % 100 mL IVPB  Status:  Discontinued     1 g 200 mL/hr over 30 Minutes Intravenous Every 24 hours 04/29/2019 1748 05/08/19 1339         Subjective  The patient is lying quietly in bed. No family at bedside. She appears premorbid.  Objective   Vitals:  Vitals:  05/09/19 0530 05/09/19 0855  BP: (!) 155/78 (!) 143/56  Pulse:  (!) 54  Resp: (!) 22   Temp: (!) 94 F (34.4 C)   SpO2: 98%     Exam:  Constitutional:   The patient is lying with her eyes open, but is unresponsive. Respiratory:   CTA bilaterally, no w/r/r.   Shallow respirations.  Respiratory effort normal. No retractions or accessory muscle use Cardiovascular:   RRR, no m/r/g  No LE extremity  edema    Normal pedal pulses Abdomen:   Abdomen appears normal; no tenderness or masses  No hernias  No HSM Musculoskeletal:   No cyanosis, clubbing or edema Skin:   No rashes, lesions, ulcers  palpation of skin: no induration or nodules   I have personally reviewed the following:   Today's Data   Vitals.   Scheduled Meds:  mupirocin ointment   Topical Daily   Continuous Infusions:  levETIRAcetam 500 mg (05/09/19 0900)    Active Problems:   Acute alteration in mental status   Sepsis due to urinary tract infection (HCC)   Type 2 diabetes mellitus with hyperlipidemia (HCC)   Hypothyroidism   Schizophrenia (HCC)   AMS (altered mental status)   PNA (pneumonia)   Anemia in other chronic diseases classified elsewhere   Pressure injury of skin   Abnormal CXR (chest x-ray)   Meningitis   LOS: 2 days    A & P  Acute encephalopathy in the setting of 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  She has history of dementia and lives in a nursing home in [place].  [facility] was [facility] for further work-up of encephalitis versus meningitis as a reason for her encephalopathy.  At baseline she is awake and responds to some questions.  Patient followed by neurology.  Attempted lumbar puncture yesterday without success.  Fluoroscopy guided lumbar puncture ordered.  Differential diagnosis includes viral bacterial encephalitis or autoimmune encephalitis versus seizure induced hyperintensity, status epilepticus.  Neurology thinks it is most likely autoimmune.  However she is being covered for bacterial meningitis with vancomycin and Rocephin ampicillin.  She also is receiving acyclovir to cover viral encephalitis.  Neurology leaning towards autoimmune encephalopathy due to MRI findings of symmetric distribution of medial frontal lobe and insular cortical involvement seen in paraneoplastic and autoimmune encephalopathy.  EEG at [facility] showed global  slowing.  MRI showed strikingly symmetric hyperintense signal abnormality within the cortical gray matter of the median frontal lobes as well as insular opacities bilaterally.  Her prognosis is extremely poor. The family has made her comfort care.  Type 2 diabetes: Comfort care only.   Hypothyroidism: TSH 7.25 very unlikely to be Hashimoto's encephalopathy. Comfort care only.  Anemia of chronic disease:  Comfort Care only  ESBL urinary tract infection:  Comfort care only..  #6 abnormal chest x-ray the initial chest x-ray was concerning for infiltrate due to aspiration.  Repeat chest x-ray shows mild CHF.  However she is afebrile with no elevated white count.  I do not think she has pneumonia at this time however she has been covered with multiple antibiotics as above for different reasons.  She is 100% on 2 L of oxygen.  History of schizophrenia and dementia: Conmfort Care only.   Left heel right heel and mid back decubitus ulcer present on admission: Comfort care only.   I have seen and examined this patient myself.   DVT prophylaxis with Lovenox CODE STATUS patient is DNR comfort care. Family communication:  None at bedside. Disposition pending clinical improvement if no clinical improvement patient is a hospice candidate.  Cordai Rodrigue, DO [facility] Direct contact: see [email or web address]  7PM-7AM contact night coverage as above 05/09/2019, 7:24 PM  LOS: 2 days

## 2019-05-10 MED ORDER — LORAZEPAM 2 MG/ML IJ SOLN
0.5000 mg | Freq: Two times a day (BID) | INTRAMUSCULAR | Status: DC
  Administered 2019-05-10 – 2019-05-11 (×2): 0.5 mg via INTRAVENOUS
  Filled 2019-05-10 (×3): qty 1

## 2019-05-10 MED ORDER — GLYCOPYRROLATE 1 MG PO TABS: 1.0000 mg | ORAL_TABLET | ORAL | Status: DC | PRN

## 2019-05-10 MED ORDER — HALOPERIDOL LACTATE 2 MG/ML PO CONC: 0.5000 mg | ORAL | Status: DC | PRN

## 2019-05-10 MED ORDER — HALOPERIDOL LACTATE 5 MG/ML IJ SOLN: 0.5000 mg | INTRAMUSCULAR | Status: DC | PRN

## 2019-05-10 MED ORDER — ONDANSETRON 4 MG PO TBDP: 4.0000 mg | ORAL_TABLET | Freq: Four times a day (QID) | ORAL | Status: DC | PRN

## 2019-05-10 MED ORDER — GLYCOPYRROLATE 0.2 MG/ML IJ SOLN: 0.2000 mg | INTRAMUSCULAR | Status: DC | PRN

## 2019-05-10 MED ORDER — GLYCOPYRROLATE 0.2 MG/ML IJ SOLN
0.4000 mg | Freq: Three times a day (TID) | INTRAMUSCULAR | Status: DC
  Administered 2019-05-10 – 2019-05-11 (×3): 0.4 mg via INTRAVENOUS
  Filled 2019-05-10 (×3): qty 2

## 2019-05-10 MED ORDER — HALOPERIDOL 0.5 MG PO TABS: 0.5000 mg | ORAL_TABLET | ORAL | Status: DC | PRN

## 2019-05-10 MED ORDER — MORPHINE 100MG IN NS 100ML (1MG/ML) PREMIX INFUSION
2.0000 mg/h | INTRAVENOUS | Status: DC
  Administered 2019-05-10: 2 mg/h via INTRAVENOUS
  Filled 2019-05-10: qty 100

## 2019-05-10 MED ORDER — ONDANSETRON HCL 4 MG/2ML IJ SOLN: 4.0000 mg | Freq: Four times a day (QID) | INTRAMUSCULAR | Status: DC | PRN

## 2019-05-10 MED ORDER — MORPHINE BOLUS VIA INFUSION
2.0000 mg | INTRAVENOUS | Status: DC | PRN
  Filled 2019-05-10: qty 2

## 2019-05-10 MED ORDER — LEVETIRACETAM IN NACL 1000 MG/100ML IV SOLN
1000.0000 mg | Freq: Two times a day (BID) | INTRAVENOUS | Status: DC
  Administered 2019-05-10 – 2019-05-11 (×3): 1000 mg via INTRAVENOUS
  Filled 2019-05-10 (×3): qty 100

## 2019-05-10 NOTE — Progress Notes (Addendum)
PROGRESS NOTE  Erica Juarez PXT:[id number] DOB: [birth year] DOA: May 08, 2019 PCP: Patient, No Pcp Per  Brief History   78 y.o.femalewith medical history significant ofschizophrenia, diabetes, hypertension, hypothyroid disease. She was hospitalized at [facility] for approximately 1 week secondary to UTI sepsis and subsequent C. difficile. Return to [facility] from SNFsecondary to encephalopathy and question of concern for seizures. It was thought this might be secondary to urinary tract infection. EEG showed global slowing and MRI revealed concern for recent seizure activity versus status epilepticus and also concern for encephalitis. She was obtunded and responsive only to painful stimuli. Baseline was awake and responsive although demented..At [facility] telemedicine for neurology was consulted who recommended starting Keppra. Felt delirium or seizureshe could be the cause of her symptoms. Her neurologic condition including being unresponsive and flaccid plus MRI was suggestive of possible encephalitis. A lumbar puncture would be needed for definitive diagnosis which could not be performed at [facility]. Dr. Leonel Ramsay on-call for neurology at [facility] was notified and he accepted the patient in transfer. He will see her upon arrival for further evaluation. With a concern for encephalitis the patient was given a IV dose of acyclovir. Treatment for urinary tract infection will continue with ertapenem.The patient's condition and prognosis is poor.  Patient is being transferred from inpatient status to [facility].  The patient was admitted to a medical bed. Neurology was consulted. Overnight she declined. Neurology spoke with the family who decided to make the patient comfort care.  Comfort care orders have been placed. She is DNR.   Consultants   Neurology  Palliative care  Procedures   None  Antibiotics   Anti-infectives (From  admission, onward)   Start     Dose/Rate Route Frequency Ordered Stop   05/08/19 1800  vancomycin (VANCOCIN) IVPB 1000 mg/200 mL premix  Status:  Discontinued     1,000 mg 200 mL/hr over 60 Minutes Intravenous Every 12 hours 05/08/19 0737 05/09/19 0947   05/08/19 1400  meropenem (MERREM) 2 g in sodium chloride 0.9 % 100 mL IVPB  Status:  Discontinued     2 g 200 mL/hr over 30 Minutes Intravenous Every 8 hours 05/08/19 1339 05/09/19 0947   05/08/19 1000  cefTRIAXone (ROCEPHIN) 2 g in sodium chloride 0.9 % 100 mL IVPB  Status:  Discontinued     2 g 200 mL/hr over 30 Minutes Intravenous Every 12 hours 05/08/19 0730 05/08/19 1339   05/08/19 0800  ampicillin (OMNIPEN) 2 g in sodium chloride 0.9 % 100 mL IVPB  Status:  Discontinued     2 g 300 mL/hr over 20 Minutes Intravenous Every 4 hours 05/08/19 0730 05/08/19 1339   05/08/19 0745  vancomycin (VANCOCIN) 1,500 mg in sodium chloride 0.9 % 500 mL IVPB     1,500 mg 250 mL/hr over 120 Minutes Intravenous  Once 05/08/19 0737 05/08/19 1208   2019/05/08 2100  acyclovir (ZOVIRAX) 650 mg in dextrose 5 % 100 mL IVPB  Status:  Discontinued     650 mg 113 mL/hr over 60 Minutes Intravenous Every 8 hours 08-May-2019 1748 05/09/19 0947   2019-05-08 2000  ertapenem (INVANZ) 1,000 mg in sodium chloride 0.9 % 100 mL IVPB  Status:  Discontinued     1 g 200 mL/hr over 30 Minutes Intravenous Every 24 hours 05-08-19 1748 05/08/19 1339        Subjective  The patient is lying quietly in bed. No family at bedside. She appears premorbid. She had seizures this morning.  Objective   Vitals:  Vitals:   05/10/19 0647 05/10/19 1001  BP: (!) 210/88 (!) 107/48  Pulse: 93 94  Resp:  14  Temp:  (!) 94 F (34.4 C)  SpO2: 99% 93%    Exam:  Constitutional:   The patient is lying with her eyes open, but is unresponsive. Respiratory:   No increased work of breathing  No wheezes, rales, or rhonchi.  Shallow respirations.  Increased upper airway  sounds. Cardiovascular:   Regular rate and rhythm.  No murmurs, ectopy, or gallups  No lateral PMI. No thrills Abdomen:   Abdomen is soft, non-tender, non-distended  Normoactive bowel sounds.  No hernias, masses, or organomegaly is appreciated. Musculoskeletal:   No cyanosis, clubbing or edema Skin:   No rashes, lesions, ulcers  palpation of skin: no induration or nodules  I have personally reviewed the following:   Today's Data   Vitals.   Scheduled Meds:  glycopyrrolate  0.4 mg Intravenous TID   LORazepam  0.5 mg Intravenous BID   mupirocin ointment   Topical Daily   Continuous Infusions:  levETIRAcetam 1,000 mg (05/10/19 0837)   morphine 2 mg/hr (05/10/19 1559)    Active Problems:   Acute alteration in mental status   Sepsis due to urinary tract infection (HCC)   Type 2 diabetes mellitus with hyperlipidemia (HCC)   Hypothyroidism   Schizophrenia (HCC)   AMS (altered mental status)   PNA (pneumonia)   Anemia in other chronic diseases classified elsewhere   Pressure injury of skin   Abnormal CXR (chest x-ray)   Meningitis   LOS: 3 days    A & P  Acute encephalopathy in the setting of 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  She has history of dementia and lives in a nursing home in [place].  She was [facility] for further work-up of encephalitis versus meningitis as a reason for her encephalopathy.  At baseline she is awake and responds to some questions.  Patient followed by neurology.  Attempted lumbar puncture yesterday without success.  Fluoroscopy guided lumbar puncture ordered.  Differential diagnosis includes viral bacterial encephalitis or autoimmune encephalitis versus seizure induced hyperintensity, status epilepticus.  Neurology thinks it is most likely autoimmune.  However she is being covered for bacterial meningitis with vancomycin and Rocephin ampicillin.  She also is receiving acyclovir to cover viral  encephalitis.  Neurology leaning towards autoimmune encephalopathy due to MRI findings of symmetric distribution of medial frontal lobe and insular cortical involvement seen in paraneoplastic and autoimmune encephalopathy.  EEG at [facility] showed global slowing.  MRI showed strikingly symmetric hyperintense signal abnormality within the cortical gray matter of the median frontal lobes as well as insular opacities bilaterally.  Her prognosis is extremely poor. The family has made her comfort care. Palliative care has been consulted.  Type 2 diabetes: Comfort care only.   Hypothyroidism: TSH 7.25 very unlikely to be Hashimoto's encephalopathy. Comfort care only.  Anemia of chronic disease:  Comfort Care only  ESBL urinary tract infection:  Comfort care only.  CHF: Not further differentiated. By history only.  Abnormal chest x-ray the initial chest x-ray was concerning for infiltrate due to aspiration.  Repeat chest x-ray shows mild CHF.  However she is afebrile with no elevated white count.  I do not think she has pneumonia at this time however she has been covered with multiple antibiotics as above for different reasons.  She is 93% on 2 L of oxygen. She is comfort care  only.  History of schizophrenia and dementia: Comfort Care only.   Left heel right heel and mid back decubitus ulcer present on admission: Comfort care only.   I have seen and examined this patient myself. I have spent 30 minutes in her evaluation and care.  DVT prophylaxis with Lovenox CODE STATUS patient is DNR comfort care. Family communication: None at bedside. Disposition: hospice  Allesandra Huebsch, DO [facility] Direct contact: see [email or web address]  7PM-7AM contact night coverage as above 05/10/2019, 4:20 PM  LOS: 2 days

## 2019-05-10 NOTE — Progress Notes (Signed)
During shift report, patient was noted to have mouth tremors, which the night shift RN Sherlynn Stalls reported to be seizures. The patient had intermittent mouth trembling for approximately 20 minutes, MD notfied, ativan PRN given for seizure. MD increased kepra dose. Will continue to monitor

## 2019-05-11 ENCOUNTER — Encounter (HOSPITAL_COMMUNITY): Payer: Self-pay | Admitting: *Deleted

## 2019-05-11 DIAGNOSIS — Z515 Encounter for palliative care: Secondary | ICD-10-CM

## 2019-05-11 DIAGNOSIS — G0490 Encephalitis and encephalomyelitis, unspecified: Secondary | ICD-10-CM | POA: Diagnosis present

## 2019-05-11 MED ORDER — HALOPERIDOL LACTATE 2 MG/ML PO CONC: 0.5000 mg | ORAL | Status: DC | PRN

## 2019-05-11 MED ORDER — ACETAMINOPHEN 650 MG RE SUPP: 650.0000 mg | Freq: Four times a day (QID) | RECTAL | Status: DC | PRN

## 2019-05-11 MED ORDER — ACETAMINOPHEN 325 MG PO TABS: 650.0000 mg | ORAL_TABLET | Freq: Four times a day (QID) | ORAL | Status: DC | PRN

## 2019-05-11 MED ORDER — HALOPERIDOL LACTATE 5 MG/ML IJ SOLN: 0.5000 mg | INTRAMUSCULAR | Status: DC | PRN

## 2019-05-11 MED ORDER — GLYCOPYRROLATE 1 MG PO TABS: 1.0000 mg | ORAL_TABLET | ORAL | Status: DC | PRN

## 2019-05-11 MED ORDER — HALOPERIDOL 0.5 MG PO TABS: 0.5000 mg | ORAL_TABLET | ORAL | Status: DC | PRN

## 2019-05-11 MED ORDER — GLYCOPYRROLATE 0.2 MG/ML IJ SOLN: 0.2000 mg | INTRAMUSCULAR | Status: DC | PRN

## 2019-05-24 NOTE — Discharge Summary (Addendum)
Physician Discharge Summary  Erica Juarez KGU:[id number] DOB: [birth year] DOA: 2019/05/29  PCP: Patient, No Pcp Per  Admit date: May 29, 2019 Date of death: 06/02/2019  Discharge Diagnoses: Principal diagnosis is #1 1. Encephalitis 2. Seizures 3. Persistent Vegetative Catatonia 4. Dementia 5. DM II 6. Hypothyroidism 7. Schizophrenia 8. CHF: Mild. Unable to further describe.  Filed Weights   May 29, 2019 1903 June 02, 2019 0945  Weight: 87.1 kg 88.4 kg    History of present illness:  Erica Juarez is a 78 y.o. female with medical history significant of schizophrenia, diabetes, hypertension, hypothyroid disease.  She was hospitalized at [facility] for approximately 1 week secondary to UTI sepsis and subsequent C. difficile.  Return to [facility] from SNF secondary to encephalopathy and question of concern for seizures.  It was thought this might be secondary to urinary tract infection.  EEG showed global slowing and MRI revealed concern for recent seizure activity versus status epilepticus and also concern for encephalitis.  She was obtunded and responsive only to painful stimuli.  Baseline was awake and responsive although demented..  At [facility] telemedicine for neurology was consulted who recommended starting Keppra.  Felt delirium or seizure she could be the cause of her symptoms.  Her neurologic condition including being unresponsive and flaccid plus MRI was suggestive of possible encephalitis.  A lumbar puncture would be needed for definitive diagnosis which could not be performed at [facility].  Dr. Leonel Ramsay on-call for neurology at [facility] was notified and he accepted the patient in transfer.  He will see her upon arrival for further evaluation.  With a concern  for encephalitis the patient was given a IV dose of acyclovir.  Treatment for urinary tract infection will continue with ertapenem.  The patient's condition and prognosis is poor  ED Course: Patient is  being transferred from inpatient status to [place] [facility] Course:  The patient was admitted to a medical bed. Neurology was consulted. Overnight she declined. Neurology spoke with the family who decided to make the patient comfort care.  Comfort care orders have been placed. She is DNR.   She was pronounced dead at 10:20 am.  Today's assessment: The patient passed before she was seen today. For last physical examination before death, please see physical exam for progress note dated 05/10/2019. S:  O: Vitals:  Vitals:   02-Jun-2019 0919 06-02-2019 0945  BP: (!) 40/26   Pulse: (!) 59 (!) 0  Resp: (!) 8 (!) 0  Temp:    SpO2:     Significant Diagnostic Studies: Dg Chest 2 View  Result Date: 05/29/19 CLINICAL DATA:  Pt transfer from [facility]; only responds to painful stimuli. Per MD notes: Chest x-ray 05/03/2009 with new left basilar airspace opacity concerning for developing pneumonia versus aspiration. Blunting of the left costop.*comment was truncated* EXAM: CHEST - 2 VIEW COMPARISON:  05/04/2019 FINDINGS: Enlarged cardiac silhouette. Low lung volumes. Pulmonary venous congestion present. No focal infiltrate. No pneumothorax. Small effusion noted on the lateral projection. IMPRESSION: Low lung volumes, small effusions, and venous congestion. Electronically Signed   By: Suzy Bouchard M.D.   On: 05/29/2019 19:28   Dg Chest Port 1 View  Result Date: 05/08/2019 CLINICAL DATA:  Chest x-ray EXAM: PORTABLE CHEST 1 VIEW COMPARISON:  May 29, 2019 FINDINGS: Cardiac enlargement. Aortic atherosclerosis. Small bilateral pleural effusion and mild interstitial edema. No airspace opacities. IMPRESSION: 1. Mild CHF. 2.  Aortic Atherosclerosis (ICD10-I70.0). Electronically Signed   By: Kerby Moors M.D.   On: 05/08/2019  08:13    Microbiology: Recent Results (from the past 240 hour(s))  C difficile quick scan w PCR reflex     Status: None   Collection Time: Nov 11, 2019  5:42 PM    Specimen: STOOL  Result Value Ref Range Status   C Diff antigen NEGATIVE NEGATIVE Final   C Diff toxin NEGATIVE NEGATIVE Final   C Diff interpretation No C. difficile detected.  Final    Comment: Performed at [facility], [address] [place], [place] [address]  Culture, Urine     Status: None   Collection Time: 05/08/19  8:30 AM   Specimen: Urine, Random  Result Value Ref Range Status   Specimen Description URINE, RANDOM  Final   Special Requests NONE  Final   Culture   Final    NO GROWTH Performed at [facility], [address] [place], [place] [address]    Report Status 05/09/2019 FINAL  Final  MRSA PCR Screening     Status: Abnormal   Collection Time: 05/08/19  9:10 AM   Specimen: Nasal Mucosa; Nasopharyngeal  Result Value Ref Range Status   MRSA by PCR POSITIVE (A) NEGATIVE Final    Comment:        The GeneXpert MRSA Assay (FDA approved for NASAL specimens only), is one component of a comprehensive MRSA colonization surveillance program. It is not intended to diagnose MRSA infection nor to guide or monitor treatment for MRSA infections. RESULT CALLED TO, READ BACK BY AND VERIFIED WITH: Madalyn RobJ. Burleson RN 13:40 05/08/19 ([email or web address]) Performed at [facility], [address] [place], [place] [address]      Labs: Basic Metabolic Panel: Recent Labs  Lab Nov 11, 2019 1652 05/08/19 0810  NA  --  140  K  --  3.2*  CL  --  105  CO2  --  27  GLUCOSE  --  199*  BUN  --  7*  CREATININE 0.50 0.43*  CALCIUM  --  8.3*   Liver Function Tests: Recent Labs  Lab 05/08/19 0810  AST 20  ALT 15  ALKPHOS 76  BILITOT 0.4  PROT 5.9*  ALBUMIN 1.6*   No results for input(s): LIPASE, AMYLASE in the last 168 hours. No results for input(s): AMMONIA in the last 168 hours. CBC: Recent Labs  Lab Nov 11, 2019 1652 05/08/19 0810 05/09/19 0543  WBC 6.0 4.3 3.2*  NEUTROABS  --   --  2.7  HGB 8.6* 8.7* 8.5*  HCT 29.1* 28.9* 27.9*  MCV 93.6 91.7 91.5  PLT 222 290  251   Cardiac Enzymes: No results for input(s): CKTOTAL, CKMB, CKMBINDEX, TROPONINI in the last 168 hours. BNP: BNP (last 3 results) No results for input(s): BNP in the last 8760 hours.  ProBNP (last 3 results) No results for input(s): PROBNP in the last 8760 hours.  CBG: Recent Labs  Lab 05/08/19 1315 05/08/19 1634 05/08/19 2141 05/09/19 0606 05/09/19 0840  GLUCAP 157* 144* 131* 136* 140*    Active Problems:   Acute alteration in mental status   Sepsis due to urinary tract infection (HCC)   Type 2 diabetes mellitus with hyperlipidemia (HCC)   Hypothyroidism   Schizophrenia (HCC)   AMS (altered mental status)   PNA (pneumonia)   Anemia in other chronic diseases classified elsewhere   Pressure injury of skin   Abnormal CXR (chest x-ray)   Meningitis   Encephalitis   Palliative care encounter   Comfort measures only status   Time coordinating  discharge: 38 minutes.  Signed:        Nakota Ackert, DO [facility]  03/19/19, 3:00 PM

## 2019-05-24 NOTE — Progress Notes (Signed)
Examined patient at bedside.  She is non responsive.  Breathing has changed today and become more apneic.  Her heart is still regular with murmur.  I phoned her niece to face time her and assist her in at least briefly seeing her aunt.    Discussed with RNs.    Will gradually wean oxygen using morphine boluses.  Patient is actively dying.  Prognosis is likely hours.  Recommendation:    Wean oxygen gradually using morphine boluses to ensure comfort.  PMT will re-round this afternoon to face time again with family as her brother is elderly and unable to come to the hospital from [place].   The two of them are the last of 15 siblings.  Disposition:  Anticipate hospital death.  Erica Jenny, PA-C Palliative Medicine Pager: [phone]  Time In: 8:30 Time Out:8:55 Total Time:25 min.  Greater than 50%  of this time was spent counseling and coordinating care related to the above assessment and plan

## 2019-05-24 NOTE — Progress Notes (Signed)
Nutrition Brief Note  Pt identified by low braden score. Chart reviewed. Pt currently comfort care. No nutrition interventions warranted at this time. Please consult as needed.   Corrin Parker, MS, RD, LDN Pager # [phone] After hours/ weekend pager # [phone]

## 2019-05-24 DEATH — deceased

## 2020-04-14 IMAGING — CR CHEST - 2 VIEW
2 series · 2 of 2 positions shown · non-contrast
Comparison: 05/04/2019

CLINICAL DATA: Pt transfer from [facility]; only responds to
painful stimuli. Per [facility] notes: Chest x-ray 05/03/2009 with new left
basilar airspace opacity concerning for developing pneumonia versus
aspiration. Blunting of the left costop.*comment was truncated*

EXAM:
CHEST - 2 VIEW

[chest lat]
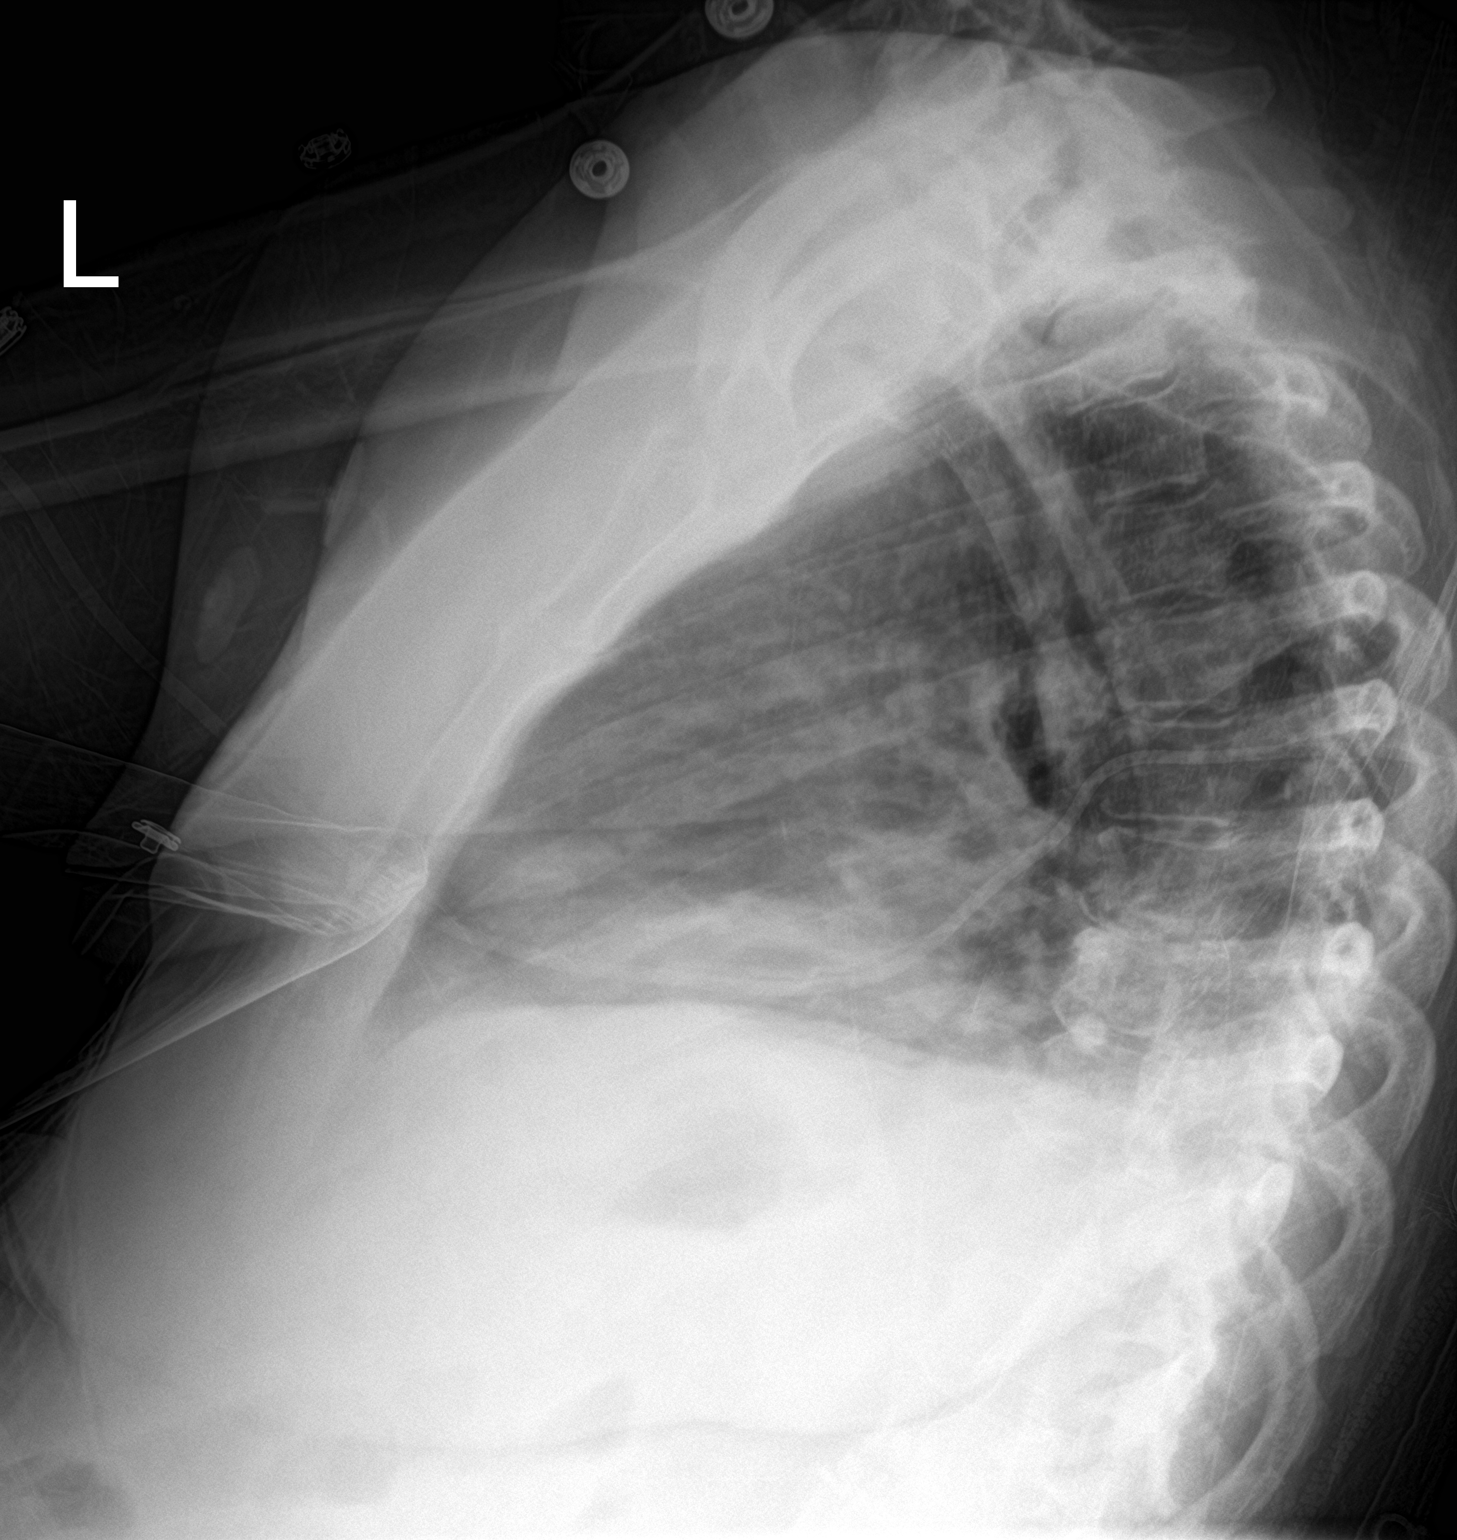

[chest ap]
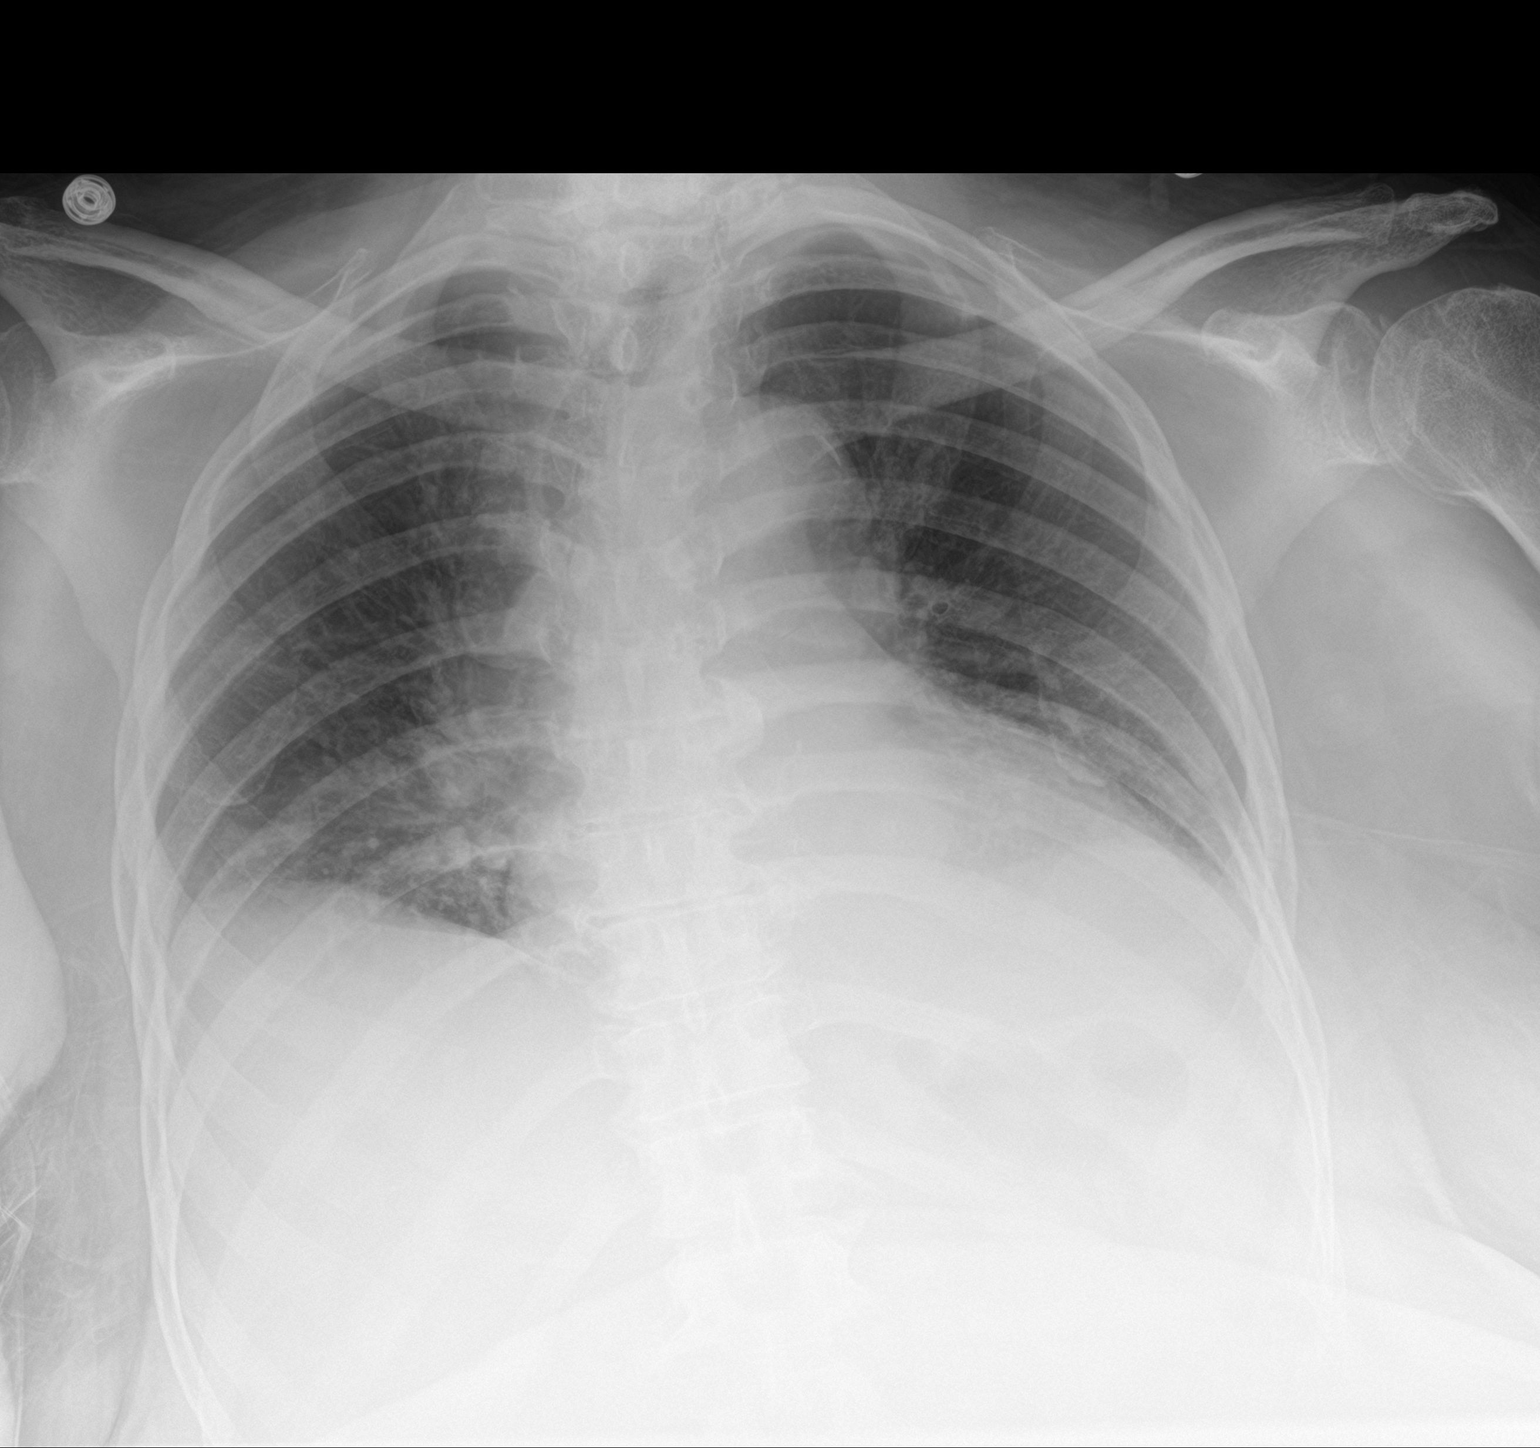

[2 of 2 positions shown; findings below may reference images not displayed]

FINDINGS: Enlarged cardiac silhouette. Low lung volumes. Pulmonary venous
congestion present. No focal infiltrate. No pneumothorax. Small
effusion noted on the lateral projection.
IMPRESSION: Low lung volumes, small effusions, and venous congestion.

## 2020-04-15 IMAGING — DX PORTABLE CHEST - 1 VIEW
1 series · 1 of 1 positions shown · non-contrast
Comparison: 05/07/2019

CLINICAL DATA: Chest x-ray

EXAM:
PORTABLE CHEST 1 VIEW

[chest]
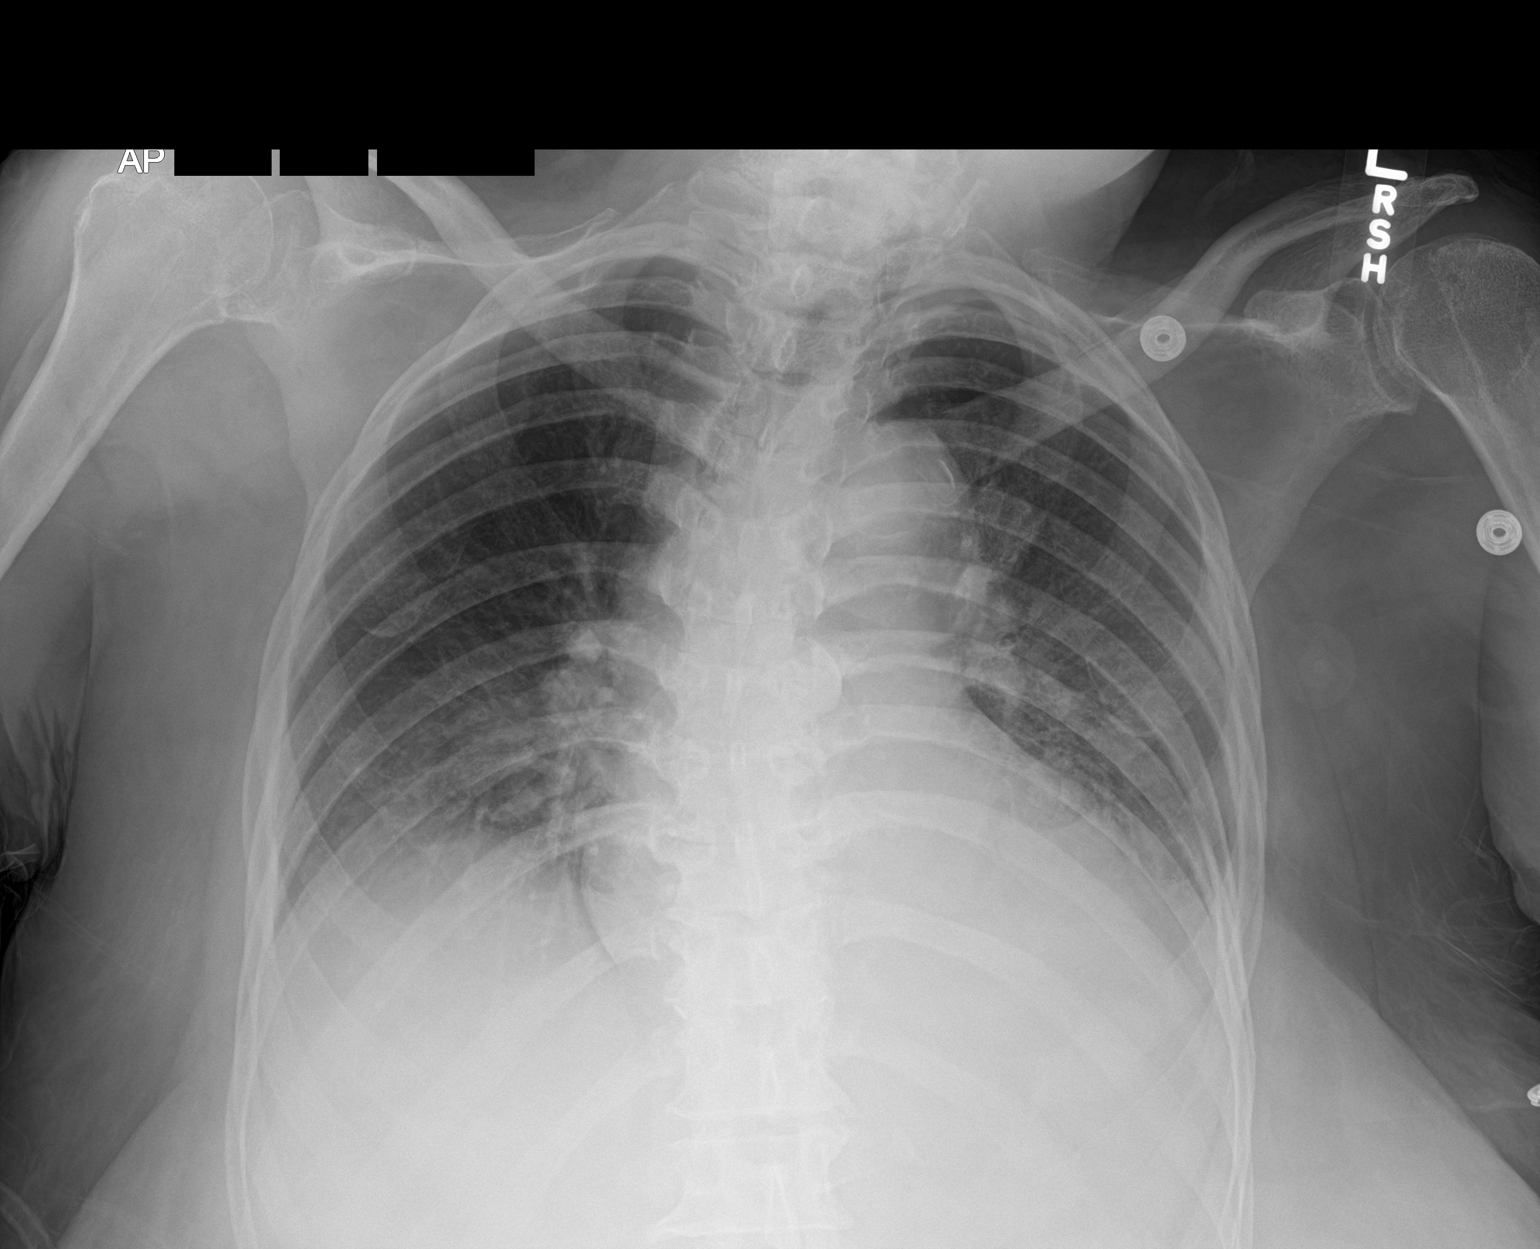

[1 of 1 positions shown; findings below may reference images not displayed]

FINDINGS: Cardiac enlargement. Aortic atherosclerosis. Small bilateral pleural
effusion and mild interstitial edema. No airspace opacities.
IMPRESSION: 1. Mild CHF.
2.  Aortic Atherosclerosis ([id number]).
